# Patient Record
Sex: Male | Born: 1970 | Race: White | Hispanic: No | Marital: Married | Smoking: Never smoker
Health system: Southern US, Community
[De-identification: ages and names within clinical notes are randomized; demographics above are authoritative.]

## PROBLEM LIST (undated history)

## (undated) DIAGNOSIS — R053 Chronic cough: Secondary | ICD-10-CM

## (undated) DIAGNOSIS — T7840XA Allergy, unspecified, initial encounter: Secondary | ICD-10-CM

## (undated) DIAGNOSIS — E785 Hyperlipidemia, unspecified: Secondary | ICD-10-CM

## (undated) DIAGNOSIS — J329 Chronic sinusitis, unspecified: Secondary | ICD-10-CM

## (undated) HISTORY — DX: Chronic cough: R05.3

## (undated) HISTORY — PX: SINUS SURGERY WITH INSTATRAK: SHX5215

## (undated) HISTORY — PX: TONSILLECTOMY: SUR1361

## (undated) HISTORY — PX: ADENOIDECTOMY: SUR15

## (undated) HISTORY — DX: Allergy, unspecified, initial encounter: T78.40XA

## (undated) HISTORY — DX: Hyperlipidemia, unspecified: E78.5

## (undated) HISTORY — DX: Chronic sinusitis, unspecified: J32.9

---

## 2016-04-12 DIAGNOSIS — M545 Low back pain, unspecified: Secondary | ICD-10-CM | POA: Insufficient documentation

## 2017-04-20 NOTE — Progress Notes (Signed)
[facility] [address] [place], [place] [address] Phone: [phone] Subjective:    I'm seeing this patient by the request  of:  Patient, No Pcp Per   CC: Neck pain  CVE:[id number]  Seth Lowery is a 46 y.o. male coming in with complaint of neck pain. Patient states sometimes it seems to be associated with headaches. Patient [place] significant workup over the last 7 a months for more of a chronic sinusitis. Patient has had significant changes as well as some mild shortness of breath. Patient has undergone further testing. Patient states that unfortunately he has not made any significant improvement. Continues that usually in the midmorning to start having significant congestion went and [place] neck pain. Has been seen a massage therapist as well as physical therapy with very minimal benefit. Has been seen by ENT without any significant improvement as well.  Lung testing 10 included a CT angiogram did not show any type of pulmonary embolism but did have bilateral pleural effusions even though they are small.  CT scan of the face didn't show that patient does have left maxillary sinus mucosal thickening in severe opacification of the left anterior ethmoid  No past medical history on file. No past surgical history on file. Social History   Social History  . Marital status: Unknown    Spouse name: N/A  . Number of children: N/A  . Years of education: N/A   Social History Main Topics  . Smoking status: Not on file  . Smokeless tobacco: Not on file  . Alcohol use Not on file  . Drug use: Unknown  . Sexual activity: Not on file   Other Topics Concern  . Not on file   Social History Narrative  . No narrative on file   Allergies not on file No family history on file.   Past medical history, social, surgical and family history all reviewed in electronic medical record.  No pertanent information unless stated regarding to the chief complaint.    Review of Systems:Review of systems updated and as accurate as of 04/20/17  No headache, visual changes, nausea, vomiting, diarrhea, constipation, dizziness, abdominal pain, skin rash, fevers, chills, night sweats, weight loss, swollen lymph nodes, body aches, joint swelling, muscle aches, chest pain, shortness of breath, mood changes.   Objective  There were no vitals taken for this visit. Systems examined below as of 04/20/17   General: No apparent distress alert and oriented x3 mood and affect normal, dressed appropriately.  HEENT: Pupils equal, extraocular movements intact  Respiratory: Patient's speak in full sentences and does not appear short of breath  Cardiovascular: No lower extremity edema, non tender, no erythema  Skin: Warm dry intact with no signs of infection or rash on extremities or on axial skeleton.  Abdomen: Soft nontender  Neuro: Cranial nerves II through XII are intact, neurovascularly intact in all extremities with 2+ DTRs and 2+ pulses.  Lymph: No lymphadenopathy of posterior or anterior cervical chain or axillae bilaterally.  Gait normal with good balance and coordination.  MSK:  Non tender with full range of motion and good stability and symmetric strength and tone of shoulders, elbows, wrist, hip, knee and ankles bilaterally.  Neck: Inspection unremarkable. No palpable stepoffs. Negative Spurling's maneuver. Mild limitation in side bending bilaterally Grip strength and sensation normal in bilateral hands Strength good C4 to T1 distribution No sensory change to C4 to T1 Negative Hoffman sign bilaterally Reflexes normal  Osteopathic findings C2 flexed rotated  and side bent right C4 flexed rotated and side bent left C6 flexed rotated and side bent left T3 extended rotated and side bent right inhaled third rib T5 extended rotated and side bent left     Impression and Recommendations:     This case required medical decision making of moderate  complexity.      Note: This dictation was prepared with Dragon dictation along with smaller phrase technology. Any transcriptional errors that result from this process are unintentional.

## 2017-04-22 ENCOUNTER — Other Ambulatory Visit (INDEPENDENT_AMBULATORY_CARE_PROVIDER_SITE_OTHER): Payer: BLUE CROSS/BLUE SHIELD

## 2017-04-22 ENCOUNTER — Encounter: Payer: Self-pay | Admitting: Family Medicine

## 2017-04-22 ENCOUNTER — Ambulatory Visit (INDEPENDENT_AMBULATORY_CARE_PROVIDER_SITE_OTHER): Payer: BLUE CROSS/BLUE SHIELD | Admitting: Family Medicine

## 2017-04-22 VITALS — BP 126/78 | HR 63 | Ht 76.0 in | Wt 243.0 lb

## 2017-04-22 DIAGNOSIS — R51 Headache: Secondary | ICD-10-CM

## 2017-04-22 DIAGNOSIS — M542 Cervicalgia: Secondary | ICD-10-CM | POA: Diagnosis not present

## 2017-04-22 DIAGNOSIS — R519 Headache, unspecified: Secondary | ICD-10-CM

## 2017-04-22 DIAGNOSIS — J320 Chronic maxillary sinusitis: Secondary | ICD-10-CM | POA: Diagnosis not present

## 2017-04-22 DIAGNOSIS — M999 Biomechanical lesion, unspecified: Secondary | ICD-10-CM | POA: Diagnosis not present

## 2017-04-22 DIAGNOSIS — J329 Chronic sinusitis, unspecified: Secondary | ICD-10-CM | POA: Insufficient documentation

## 2017-04-22 DIAGNOSIS — E663 Overweight: Secondary | ICD-10-CM | POA: Insufficient documentation

## 2017-04-22 LAB — C-REACTIVE PROTEIN: CRP: 0.4 mg/dL — ABNORMAL LOW (ref 0.5–20.0)

## 2017-04-22 LAB — IBC PANEL
IRON: 126 ug/dL (ref 42–165)
SATURATION RATIOS: 28.6 % (ref 20.0–50.0)
TRANSFERRIN: 315 mg/dL (ref 212.0–360.0)

## 2017-04-22 LAB — CORTISOL: CORTISOL PLASMA: 9.1 ug/dL

## 2017-04-22 LAB — ANGIOTENSIN CONVERTING ENZYME: Angiotensin-Converting Enzyme: 75 U/L — ABNORMAL HIGH (ref 9–67)

## 2017-04-22 LAB — TSH: TSH: 0.75 u[IU]/mL (ref 0.35–4.50)

## 2017-04-22 LAB — VITAMIN D 25 HYDROXY (VIT D DEFICIENCY, FRACTURES): VITD: 32.87 ng/mL (ref 30.00–100.00)

## 2017-04-22 LAB — SEDIMENTATION RATE: SED RATE: 13 mm/h (ref 0–15)

## 2017-04-22 NOTE — Assessment & Plan Note (Signed)
Patient does have more of the neck pain. And using that this can be multifactorial. We discussed posturing given exercises, work with Product/process development scientist, we discussed ergonomics, we discussed other changes that could be beneficial. Discussed over-the-counter medications. Responded well to osteopathic manipulation. Follow-up again in 3-4 weeks.

## 2017-04-22 NOTE — Assessment & Plan Note (Signed)
Decision today to treat with OMT was based on Physical Exam  After verbal consent patient was treated with HVLA, ME, FPR techniques in cervical, thoracic, lumbar and sacral areas  Patient tolerated the procedure well with improvement in symptoms  Patient given exercises, stretches and lifestyle modifications  See medications in patient instructions if given  Patient will follow up in 4 weeks 
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
## 2017-04-22 NOTE — Patient Instructions (Signed)
Good to see you  Stay active Exercises 3 times a week.  Keep monitor at eye level.  DHEA 50 mg daily for 4 weeks, will help with adrenal glands We will get labs downstairs and I will try to write you in my chart  See me again in 3-4 weeks.

## 2017-04-23 LAB — ANA: ANA: NEGATIVE

## 2017-04-23 LAB — PAN-ANCA
ANCA SCREEN: NEGATIVE
Myeloperoxidase Abs: <1
Serine Protease 3: <1

## 2017-04-23 LAB — RHEUMATOID FACTOR: Rhuematoid fact SerPl-aCnc: <14 IU/mL (ref <14)

## 2017-04-25 ENCOUNTER — Encounter: Payer: Self-pay | Admitting: Family Medicine

## 2017-04-26 NOTE — Telephone Encounter (Signed)
Pt called regarding this, Seth Lowery message got cut off and he wondering what the rest of the message is, he would like his lab results

## 2017-04-29 NOTE — Telephone Encounter (Signed)
Patient is calling again.

## 2017-04-30 ENCOUNTER — Telehealth: Payer: Self-pay | Admitting: Family Medicine

## 2017-04-30 NOTE — Telephone Encounter (Signed)
Patient would like to be worked in sooner if anything opens.  I have moved him up to the 24th.  Patient states he has labs results he would like to review with Dr. Tamala Julian at least early next week if he can.

## 2017-04-30 NOTE — Telephone Encounter (Signed)
Spoke to pt, scheduled appt for 8.15.18.

## 2017-05-01 ENCOUNTER — Encounter: Payer: Self-pay | Admitting: Family Medicine

## 2017-05-01 ENCOUNTER — Ambulatory Visit (INDEPENDENT_AMBULATORY_CARE_PROVIDER_SITE_OTHER): Payer: BLUE CROSS/BLUE SHIELD | Admitting: Family Medicine

## 2017-05-01 VITALS — BP 124/84 | HR 55 | Ht 76.0 in | Wt 240.0 lb

## 2017-05-01 DIAGNOSIS — M542 Cervicalgia: Secondary | ICD-10-CM

## 2017-05-01 DIAGNOSIS — M999 Biomechanical lesion, unspecified: Secondary | ICD-10-CM

## 2017-05-01 MED ORDER — VITAMIN D (ERGOCALCIFEROL) 1.25 MG (50000 UNIT) PO CAPS: 50000.0000 [IU] | ORAL_CAPSULE | ORAL | 0 refills | Status: DC

## 2017-05-01 NOTE — Patient Instructions (Signed)
Great to see yo  Try to keep working on the position at your desk.  Ice is your friend  Posture posture posture Good luck in the next race  Once weekly vitamin D for 12 weeks Continue the DHEA for a total of 4 weeks See me again in 4-6 weeks

## 2017-05-01 NOTE — Progress Notes (Signed)
  [name] [address] [place], [place] [address] Phone: [phone] Subjective:    I'm seeing this patient by the request  of:  Patient, No Pcp Per   CC: Neck pain f/u  [id number]  Seth Lowery is a 46 y.o. male coming in with complaint of neck pain. This seemed to be associated with more of a chronic headaches. There is a possibility with chronic sinusitis. Patient has been treated multiple times. We did start patient on over-the-counter DHEA for potential adrenal insufficiency. Patient states that overall it is significantly improving. Patient since then seems to be making reasonable improvement. Patient states that the position at work has been helpful but still needs to make some other transitions. 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   No past medical history on file. No past surgical history on file. Social History   Social History  . Marital status: Unknown    Spouse name: N/A  . Number of children: N/A  . Years of education: N/A   Social History Main Topics  . Smoking status: Never Smoker  . Smokeless tobacco: Never Used  . Alcohol use None  . Drug use: Unknown  . Sexual activity: Not Asked   Other Topics Concern  . None   Social History Narrative  . None   Not on File No family history on file.   Past medical history, social, surgical and family history all reviewed in electronic medical record.  No pertanent information unless stated regarding to the chief complaint.   Review of Systems: No headache, visual changes, nausea, vomiting, diarrhea, constipation, dizziness, abdominal pain, skin rash, fevers, chills, night sweats, weight loss, swollen lymph nodes, body aches, joint swelling,chest pain, shortness of breath, mood changes.  ositive muscle aches  Objective  Blood pressure 124/84, pulse (!) 55, height 6\' 4"  (1.93 m), weight 240 lb (108.9 kg), SpO2 97 %.   Systems examined below as of 05/01/17 General: NAD A&O x3  mood, affect normal  HEENT: Pupils equal, extraocular movements intact no nystagmus Respiratory: not short of breath at rest or with speaking Cardiovascular: No lower extremity edema, non tender Skin: Warm dry intact with no signs of infection or rash on extremities or on axial skeleton. Abdomen: Soft nontender, no masses Neuro: Cranial nerves  intact, neurovascularly intact in all extremities with 2+ DTRs and 2+ pulses. Lymph: No lymphadenopathy appreciated today  Gait normal with good balance and coordination.  MSK: Non tender with full range of motion and good stability and symmetric strength and tone of shoulders, elbows, wrist,  knee hips and ankles bilaterally.   Neck: Inspection unremarkable. No palpable stepoffs. Negative Spurling's maneuver. ild loss of range of motion lacking the last 5-10 of extension Grip strength and sensation normal in bilateral hands Strength good C4 to T1 distribution No sensory change to C4 to T1 Negative Hoffman sign bilaterally Reflexes normal  Osteopathic findings C4 flexed rotated and side bent left C6 flexed rotated and side bent left T3 extended rotated and side bent right inhaled third rib T9 extended rotated and side bent left L2 flexed rotated and side bent right Sacrum right on right      Impression and Recommendations:     This case required medical decision making of moderate complexity.      Note: This dictation was prepared with Dragon dictation along with smaller phrase technology. Any transcriptional errors that result from this process are unintentional.

## 2017-05-01 NOTE — Assessment & Plan Note (Signed)
Has done relatively well to make pain already. We discussed with patient continuing the medications. Patient's laboratory workup and was found to have some mild low normal vitamin D and will start on supplementation to help with muscle strength and endurance. Patient will continue to be active. Follow-up with me again in 4-6 weeks.

## 2017-05-01 NOTE — Assessment & Plan Note (Signed)
Decision today to treat with OMT was based on Physical Exam  After verbal consent patient was treated with HVLA, ME, FPR techniques in cervical, thoracic, lumbar and sacral areas  Patient tolerated the procedure well with improvement in symptoms  Patient given exercises, stretches and lifestyle modifications  See medications in patient instructions if given  Patient will follow up in 4-6 weeks 

## 2017-05-10 ENCOUNTER — Ambulatory Visit: Payer: BLUE CROSS/BLUE SHIELD | Admitting: Family Medicine

## 2017-05-13 ENCOUNTER — Ambulatory Visit: Payer: BLUE CROSS/BLUE SHIELD | Admitting: Family Medicine

## 2017-05-30 ENCOUNTER — Encounter: Payer: Self-pay | Admitting: Family Medicine

## 2017-05-30 ENCOUNTER — Ambulatory Visit (INDEPENDENT_AMBULATORY_CARE_PROVIDER_SITE_OTHER): Payer: BLUE CROSS/BLUE SHIELD | Admitting: Family Medicine

## 2017-05-30 ENCOUNTER — Ambulatory Visit (INDEPENDENT_AMBULATORY_CARE_PROVIDER_SITE_OTHER)
Admission: RE | Admit: 2017-05-30 | Discharge: 2017-05-30 | Disposition: A | Discharge status: Home / Self Care | Payer: BLUE CROSS/BLUE SHIELD | Source: Ambulatory Visit | Attending: Family Medicine | Admitting: Family Medicine

## 2017-05-30 VITALS — BP 130/90 | HR 70 | Ht 76.0 in | Wt 247.0 lb

## 2017-05-30 DIAGNOSIS — R062 Wheezing: Secondary | ICD-10-CM

## 2017-05-30 DIAGNOSIS — J320 Chronic maxillary sinusitis: Secondary | ICD-10-CM

## 2017-05-30 DIAGNOSIS — M999 Biomechanical lesion, unspecified: Secondary | ICD-10-CM | POA: Diagnosis not present

## 2017-05-30 DIAGNOSIS — J90 Pleural effusion, not elsewhere classified: Secondary | ICD-10-CM | POA: Diagnosis not present

## 2017-05-30 MED ORDER — DOXYCYCLINE HYCLATE 100 MG PO TABS: 100.0000 mg | ORAL_TABLET | Freq: Two times a day (BID) | ORAL | 0 refills | Status: AC

## 2017-05-30 MED ORDER — FLUCONAZOLE 200 MG PO TABS: 200.0000 mg | ORAL_TABLET | Freq: Every day | ORAL | 0 refills | Status: DC

## 2017-05-30 NOTE — Patient Instructions (Signed)
Lets get xray today  Doxycycline 100mg  2 times a day for 2 weeks.  Diflucan daily for 1 week.  Ct with contrast of chest  See me again in 2-3 weeks.

## 2017-05-30 NOTE — Assessment & Plan Note (Signed)
Has had pleural effusion previously. At this time I will repeat chest x-ray and I do feel that a CT scan of his chest as necessary. I would like to further evaluate to make sure that there is no possible: Infection that could be contributing.

## 2017-05-30 NOTE — Assessment & Plan Note (Signed)
Chronic sinusitis again. Patient given medications. Seth Lowery this will be beneficial. We'll also treat for the possibility of any type of fungal [place]. Patient will come back and see me again within 1-2 weeks. Worsening symptomsseek medical attention immediately

## 2017-05-30 NOTE — Progress Notes (Signed)
[facility] [address] [place], [place] [address] Phone: [phone] Subjective:    I'm seeing this patient by the request  of:    CC: Hand and neck pain  ENI:[id number]  Seth Lowery is a 46 y.o. male coming in with complaint of back pain. He says his back is doing better. He has been working on posture and says the exercises have helped. He believes the adjustments are helping.  Patient was having worsening shortness of breath again. Patient feels that he is wheezing again. Patient did have laboratory workup that did not show any signs of Wagner granulomatosis reviewing patient's previous imaging patient did have a CT angiogram done that was negative for any type of pulmonary and [place] and the patient does have pulmonary effusions bilaterally patient was happy and feeling better for some time but overall in the last couple days feeling worse but more of the sinuses.     No past medical history on file. No past surgical history on file. Social History   Social History  . Marital status: Unknown    Spouse name: N/A  . Number of children: N/A  . Years of education: N/A   Social History Main Topics  . Smoking status: Never Smoker  . Smokeless tobacco: Never Used  . Alcohol use None  . Drug use: Unknown  . Sexual activity: Not Asked   Other Topics Concern  . None   Social History Narrative  . None   Not on File No family history on file.   Past medical history, social, surgical and family history all reviewed in electronic medical record.  No pertanent information unless stated regarding to the chief complaint.   Review of Systems:Review of systems updated and as accurate as of 05/30/17  No  visual changes, nausea, vomiting, diarrhea, constipation, dizziness, abdominal pain, skin rash, fevers, chills, night sweats, weight loss, swollen lymph nodes, body aches, joint swelling, chest pain, shortness of breath, mood changes. Positive  headaches  Objective  Blood pressure 130/90, pulse 70, height 6\' 4"  (1.93 m), weight 247 lb (112 kg), SpO2 95 %. Systems examined below as of 05/30/17   General: No apparent distress alert and oriented x3 mood and affect normal, dressed appropriately.  HEENT: Pupils equal, extraocular movements intact Significant increasing tenderness noted. Maxillary sinuses bilaterally Respiratory: Patient does have audible wheezing even with discussing and loosening. Patient does have decrease in breath sounds in the lower lobes bilaterally Cardiovascular: No lower extremity edema, non tender, no erythema  Skin: Warm dry intact with no signs of infection or rash on extremities or on axial skeleton.  Abdomen: Soft nontender  Neuro: Cranial nerves II through XII are intact, neurovascularly intact in all extremities with 2+ DTRs and 2+ pulses.  Lymph: No lymphadenopathy of posterior or anterior cervical chain or axillae bilaterally.  Gait normal with good balance and coordination.  MSK:  Non tender with full range of motion and good stability and symmetric strength and tone of shoulders, elbows, wrist, hip, knee and ankles bilaterally.  Neck: Inspection loss in lordosis. No palpable stepoffs. Negative Spurling's maneuver. Lacks 5 of rotation bilaterally Grip strength and sensation normal in bilateral hands Strength good C4 to T1 distribution No sensory change to C4 to T1 Negative Hoffman sign bilaterally Reflexes normal  Osteopathic findings C2 flexed rotated and side bent left C4 flexed rotated and side bent left C6 flexed rotated and side bent left T3 extended rotated and side bent right inhaled third  rib T9 extended rotated and side bent left L2 flexed rotated and side bent right Sacrum left on left    Impression and Recommendations:     This case required medical decision making of moderate complexity.      Note: This dictation was prepared with Dragon dictation along with smaller  phrase technology. Any transcriptional errors that result from this process are unintentional.

## 2017-05-30 NOTE — Assessment & Plan Note (Signed)
Decision today to treat with OMT was based on Physical Exam  After verbal consent patient was treated with HVLA, ME, FPR techniques in cervical, thoracic, lumbar and sacral areas  Patient tolerated the procedure well with improvement in symptoms  Patient given exercises, stretches and lifestyle modifications  See medications in patient instructions if given  Patient will follow up in 2-3 weeks 

## 2017-06-07 ENCOUNTER — Ambulatory Visit
Admission: RE | Admit: 2017-06-07 | Discharge: 2017-06-07 | Disposition: A | Discharge status: Home / Self Care | Payer: BLUE CROSS/BLUE SHIELD | Source: Ambulatory Visit | Attending: Family Medicine | Admitting: Family Medicine

## 2017-06-07 DIAGNOSIS — J90 Pleural effusion, not elsewhere classified: Secondary | ICD-10-CM

## 2017-06-07 MED ORDER — IOPAMIDOL (ISOVUE-300) INJECTION 61%: 75.0000 mL | Freq: Once | INTRAVENOUS | Status: DC | PRN

## 2017-06-13 ENCOUNTER — Ambulatory Visit (INDEPENDENT_AMBULATORY_CARE_PROVIDER_SITE_OTHER): Payer: BLUE CROSS/BLUE SHIELD | Admitting: Family Medicine

## 2017-06-13 ENCOUNTER — Encounter: Payer: Self-pay | Admitting: Family Medicine

## 2017-06-13 VITALS — BP 128/80 | HR 62 | Ht 76.0 in | Wt 241.0 lb

## 2017-06-13 DIAGNOSIS — M542 Cervicalgia: Secondary | ICD-10-CM

## 2017-06-13 DIAGNOSIS — M999 Biomechanical lesion, unspecified: Secondary | ICD-10-CM

## 2017-06-13 NOTE — Patient Instructions (Signed)
Good to see you  Sorry no good answer Stop the cologne.  Might be time for the surgery  See me again in 4-6 weeks.

## 2017-06-13 NOTE — Progress Notes (Signed)
[name] [address] [place], [place] [address] Phone: [phone] Subjective:   :    CC: Head and neck pain f/u  [id number]  Seth Lowery is a 46 y.o. male coming in with complaint of back pain. He says his back is doing better. He has been working on posture and says the exercises have helped. He believes the adjustments are helping.  Patient was having worsening shortness of breath again. Patient feels that he is wheezing again. Patient did have laboratory workup that did not show any signs of Wagner granulomatosis reviewing patient's previous imaging patient did have a CT angiogram done that was negative for any type of pulmonary and Resolved the patient does have pulmonary effusions bilaterally patient was happy and feeling better for some time but overall in the last couple days feeling worse but more of the sinuses.     No past medical history on file. No past surgical history on file. Social History   Social History  . Marital status: Unknown    Spouse name: N/A  . Number of children: N/A  . Years of education: N/A   Social History Main Topics  . Smoking status: Never Smoker  . Smokeless tobacco: Never Used  . Alcohol use None  . Drug use: Unknown  . Sexual activity: Not Asked   Other Topics Concern  . None   Social History Narrative  . None   Not on File No family history on file.   Past medical history, social, surgical and family history all reviewed in electronic medical record.  No pertanent information unless stated regarding to the chief complaint.   Review of Systems: No visual changes, nausea, vomiting, diarrhea, constipation, dizziness, abdominal pain, skin rash, fevers, chills, night sweats, weight loss, swollen lymph nodes, body aches, joint swelling, muscle aches, chest pain, shortness of breath, mood changes.  Positive headache  Objective  Blood pressure 128/80, pulse 62, height 6\' 4"  (1.93 m), weight 241 lb  (109.3 kg), SpO2 95 %.   Systems examined below as of 06/13/17 General: NAD A&O x3 mood, affect normal  HEENT: Pupils equal, extraocular movements intact no nystagmus Respiratory: not short of breath at rest or with speaking Cardiovascular: No lower extremity edema, non tender Skin: Warm dry intact with no signs of infection or rash on extremities or on axial skeleton. Abdomen: Soft nontender, no masses Neuro: Cranial nerves  intact, neurovascularly intact in all extremities with 2+ DTRs and 2+ pulses. Lymph: No lymphadenopathy appreciated today  Gait normal with good balance and coordination.  MSK: Non tender with full range of motion and good stability and symmetric strength and tone of shoulders, elbows, wrist,  knee hips and ankles bilaterally.   Neck: Inspection loss in lordosis. No palpable stepoffs. Negative Spurling's maneuver. Lacks 5 of rotation bilaterally Grip strength and sensation normal in bilateral hands Strength good C4 to T1 distribution No sensory change to C4 to T1 Negative Hoffman sign bilaterally Reflexes normal  Osteopathic findings C2 flexed rotated and side bent right C4 flexed rotated and side bent left C7 flexed rotated and side bent left T5 extended rotated and side bent left T9 extended rotated and side bent left L2 flexed rotated and side bent right Sacrum right on right     Impression and Recommendations:     This case required medical decision making of moderate complexity.      Note: This dictation was prepared with Dragon dictation along with smaller phrase technology. Any  transcriptional errors that result from this process are unintentional.

## 2017-06-13 NOTE — Assessment & Plan Note (Signed)
Still needs work on posture. Discussed ergonomics when checked is a doing which ones to avoid. We discussed core strength and stability for the lower back pain. Follow-up with me again in 6-8 weeks.

## 2017-06-13 NOTE — Assessment & Plan Note (Signed)
Decision today to treat with OMT was based on Physical Exam  After verbal consent patient was treated with HVLA, ME, FPR techniques in cervical, thoracic, lumbar and sacral areas  Patient tolerated the procedure well with improvement in symptoms  Patient given exercises, stretches and lifestyle modifications  See medications in patient instructions if given  Patient will follow up in 4-8 weeks 
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
## 2017-07-21 ENCOUNTER — Other Ambulatory Visit: Payer: Self-pay | Admitting: Family Medicine

## 2017-07-22 NOTE — Telephone Encounter (Signed)
Refill done.  

## 2018-01-14 LAB — BASIC METABOLIC PANEL
CREATININE: 1 (ref 0.6–1.3)
Glucose: 103
POTASSIUM: 4.4 (ref 3.4–5.3)
SODIUM: 141 (ref 137–147)

## 2018-01-14 LAB — CBC AND DIFFERENTIAL
HCT: 48 (ref 41–53)
HEMOGLOBIN: 16.2 (ref 13.5–17.5)
Neutrophils Absolute: 61
PLATELETS: 249 (ref 150–399)
WBC: 7.6

## 2018-01-14 LAB — HEPATIC FUNCTION PANEL
ALT: 39 (ref 10–40)
AST: 22 (ref 14–40)
Alkaline Phosphatase: 83 (ref 25–125)

## 2018-01-14 LAB — TSH: TSH: 1.38 (ref 0.41–5.90)

## 2018-01-14 LAB — HEMOGLOBIN A1C: Hemoglobin A1C: 16.2

## 2018-01-14 LAB — LIPID PANEL
CHOLESTEROL: 171 (ref 0–200)
HDL: 51 (ref 35–70)
LDL Cholesterol: 118
TRIGLYCERIDES: 74 (ref 40–160)

## 2018-01-16 DIAGNOSIS — J320 Chronic maxillary sinusitis: Secondary | ICD-10-CM | POA: Diagnosis not present

## 2018-01-16 DIAGNOSIS — J340 Abscess, furuncle and carbuncle of nose: Secondary | ICD-10-CM | POA: Diagnosis not present

## 2018-01-16 DIAGNOSIS — J321 Chronic frontal sinusitis: Secondary | ICD-10-CM | POA: Diagnosis not present

## 2018-01-17 DIAGNOSIS — R7301 Impaired fasting glucose: Secondary | ICD-10-CM | POA: Diagnosis not present

## 2018-01-17 DIAGNOSIS — Z0000 Encounter for general adult medical examination without abnormal findings: Secondary | ICD-10-CM | POA: Diagnosis not present

## 2018-01-17 DIAGNOSIS — E663 Overweight: Secondary | ICD-10-CM | POA: Diagnosis not present

## 2018-01-17 DIAGNOSIS — J329 Chronic sinusitis, unspecified: Secondary | ICD-10-CM | POA: Diagnosis not present

## 2018-01-30 DIAGNOSIS — J321 Chronic frontal sinusitis: Secondary | ICD-10-CM | POA: Diagnosis not present

## 2018-07-10 DIAGNOSIS — J3089 Other allergic rhinitis: Secondary | ICD-10-CM | POA: Diagnosis not present

## 2018-08-05 DIAGNOSIS — J320 Chronic maxillary sinusitis: Secondary | ICD-10-CM | POA: Diagnosis not present

## 2018-08-05 DIAGNOSIS — J339 Nasal polyp, unspecified: Secondary | ICD-10-CM | POA: Diagnosis not present

## 2018-08-05 DIAGNOSIS — J340 Abscess, furuncle and carbuncle of nose: Secondary | ICD-10-CM | POA: Diagnosis not present

## 2018-08-05 DIAGNOSIS — J321 Chronic frontal sinusitis: Secondary | ICD-10-CM | POA: Diagnosis not present

## 2018-08-05 DIAGNOSIS — J329 Chronic sinusitis, unspecified: Secondary | ICD-10-CM | POA: Diagnosis not present

## 2018-08-05 HISTORY — PX: NASAL SINUS SURGERY: SHX719

## 2018-09-04 DIAGNOSIS — J340 Abscess, furuncle and carbuncle of nose: Secondary | ICD-10-CM | POA: Diagnosis not present

## 2018-09-18 ENCOUNTER — Ambulatory Visit: Payer: BLUE CROSS/BLUE SHIELD | Admitting: Family Medicine

## 2018-09-29 ENCOUNTER — Ambulatory Visit: Payer: Commercial Managed Care - PPO | Admitting: Family Medicine

## 2018-09-29 ENCOUNTER — Encounter: Payer: Self-pay | Admitting: Family Medicine

## 2018-09-29 VITALS — BP 122/68 | HR 63 | Temp 98.0°F | Ht 76.0 in | Wt 241.0 lb

## 2018-09-29 DIAGNOSIS — L989 Disorder of the skin and subcutaneous tissue, unspecified: Secondary | ICD-10-CM

## 2018-09-29 DIAGNOSIS — J320 Chronic maxillary sinusitis: Secondary | ICD-10-CM

## 2018-09-29 NOTE — Patient Instructions (Signed)
-  Great to meet you today! -You should get a call from dermatology to schedule appt.  -Talk with ENT about using maybe bactroban (mupirocin) inside the nose to help with staph eradication if this continues to be an issue.    -F/u with me in a few months for annual physical

## 2018-09-29 NOTE — Progress Notes (Signed)
Seth Lowery - 48 y.o. male MRN [id number]  Date of birth: [birth year]  Subjective Chief Complaint  Patient presents with  . Sinusitis    He has been having ongoing sinus drainage-had sinus sx done 08/05/18-has improved some-concerned about the drainage    HPI Seth Lowery is a 48 y.o. with history of recurrent sinusitis.  Reports that he had surgery a couple of months ago and symptoms have improved however he continues to get recurrent congestion.   He had a culture completed last month at ENT that showed staph and he was treated with as course of augmentin.  He reports that ENT also told him that there may be a chronic fungal infection that could be contributing to symptoms as well.    He also has concerns about recurrent scalp lesions and areas of decreased pigmentation on the scalp.  Areas are itchy at times.  Would like to see dermatology.   ROS:  A comprehensive ROS was completed and negative except as noted per HPI  No Known Allergies  Past Medical History:  Diagnosis Date  . Recurrent sinusitis     Past Surgical History:  Procedure Laterality Date  . NASAL SINUS SURGERY  08/05/2018  . SINUS SURGERY WITH INSTATRAK      Social History   Socioeconomic History  . Marital status: Unknown    Spouse name: Not on file  . Number of children: Not on file  . Years of education: Not on file  . Highest education level: Not on file  Occupational History  . Not on file  Social Needs  . Financial resource strain: Not on file  . Food insecurity:    Worry: Not on file    Inability: Not on file  . Transportation needs:    Medical: Not on file    Non-medical: Not on file  Tobacco Use  . Smoking status: Never Smoker  . Smokeless tobacco: Never Used  Substance and Sexual Activity  . Alcohol use: Not on file  . Drug use: Not on file  . Sexual activity: Not on file  Lifestyle  . Physical activity:    Days per week: Not on file    Minutes per session: Not on file  . Stress:  Not on file  Relationships  . Social connections:    Talks on phone: Not on file    Gets together: Not on file    Attends religious service: Not on file    Active member of club or organization: Not on file    Attends meetings of clubs or organizations: Not on file    Relationship status: Not on file  Other Topics Concern  . Not on file  Social History Narrative  . Not on file    No family history on file.  Health Maintenance  Topic Date Due  . HIV Screening  07/14/1986  . TETANUS/TDAP  07/14/1990  . INFLUENZA VACCINE  Completed    ----------------------------------------------------------------------------------------------------------------------------------------------------------------------------------------------------------------- Physical Exam BP 122/68   Pulse 63   Temp 98 F (36.7 C) (Oral)   Ht 6\' 4"  (1.93 m)   Wt 241 lb (109.3 kg)   SpO2 98%   BMI 29.34 kg/m   Physical Exam Constitutional:      Appearance: Normal appearance. He is not ill-appearing.  HENT:     Head: Normocephalic and atraumatic.     Right Ear: Tympanic membrane normal.     Left Ear: Tympanic membrane normal.     Nose: No congestion.  Mouth/Throat:     Mouth: Mucous membranes are moist.  Eyes:     General: No scleral icterus. Neck:     Musculoskeletal: Neck supple.  Cardiovascular:     Rate and Rhythm: Normal rate and regular rhythm.  Pulmonary:     Effort: Pulmonary effort is normal.     Breath sounds: Normal breath sounds.  Skin:    Comments: Hypopigmented macules with a couple scattered erythematous patches on scalp   Neurological:     General: No focal deficit present.     Mental Status: He is alert.  Psychiatric:        Mood and Affect: Mood normal.        Behavior: Behavior normal.      ------------------------------------------------------------------------------------------------------------------------------------------------------------------------------------------------------------------- Assessment and Plan  Chronic sinusitis -Recommend continuing to follow with ENT.   -Previous culture with staph, may benefit from intranasal mupirocin.  He will discuss with ENT  Scalp lesion -Scattered erythematous patches with areas of post-inflammatory hypopigmentation.  Referral placed to dermatology.

## 2018-10-01 DIAGNOSIS — L989 Disorder of the skin and subcutaneous tissue, unspecified: Secondary | ICD-10-CM | POA: Insufficient documentation

## 2018-10-01 NOTE — Assessment & Plan Note (Signed)
-  Scattered erythematous patches with areas of post-inflammatory hypopigmentation.  Referral placed to dermatology.

## 2018-10-01 NOTE — Assessment & Plan Note (Signed)
-  Recommend continuing to follow with ENT.   -Previous culture with staph, may benefit from intranasal mupirocin.  He will discuss with ENT

## 2018-10-02 ENCOUNTER — Encounter: Payer: Self-pay | Admitting: Family Medicine

## 2018-10-07 ENCOUNTER — Encounter: Payer: Self-pay | Admitting: Family Medicine

## 2018-11-10 DIAGNOSIS — L739 Follicular disorder, unspecified: Secondary | ICD-10-CM | POA: Diagnosis not present

## 2018-11-10 DIAGNOSIS — D229 Melanocytic nevi, unspecified: Secondary | ICD-10-CM | POA: Diagnosis not present

## 2018-11-11 DIAGNOSIS — Z131 Encounter for screening for diabetes mellitus: Secondary | ICD-10-CM | POA: Diagnosis not present

## 2018-11-11 DIAGNOSIS — R5383 Other fatigue: Secondary | ICD-10-CM | POA: Diagnosis not present

## 2018-11-11 DIAGNOSIS — N5089 Other specified disorders of the male genital organs: Secondary | ICD-10-CM | POA: Diagnosis not present

## 2018-11-24 ENCOUNTER — Encounter: Payer: Commercial Managed Care - PPO | Admitting: Family Medicine

## 2018-12-24 ENCOUNTER — Telehealth: Payer: Self-pay | Admitting: Family Medicine

## 2018-12-24 NOTE — Telephone Encounter (Signed)
Called patient and left vm to r/s appt for 01/19/2019 a few months out.

## 2019-01-19 ENCOUNTER — Encounter: Payer: Commercial Managed Care - PPO | Admitting: Family Medicine

## 2019-03-31 ENCOUNTER — Telehealth: Payer: Self-pay

## 2019-03-31 NOTE — Telephone Encounter (Signed)
Questions for Screening COVID-19  Symptom onset: None  Travel or Contacts: None  During this illness, did/does the patient experience any of the following symptoms? Fever >100.55F []   Yes [x]   No []   Unknown Subjective fever (felt feverish) []   Yes [x]   No []   Unknown Chills []   Yes [x]   No []   Unknown Muscle aches (myalgia) []   Yes [x]   No []   Unknown Runny nose (rhinorrhea) []   Yes [x]   No []   Unknown Sore throat []   Yes [x]   No []   Unknown Cough (new onset or worsening of chronic cough) []   Yes [x]   No []   Unknown Shortness of breath (dyspnea) []   Yes [x]   No []   Unknown Nausea or vomiting []   Yes [x]   No []   Unknown Headache []   Yes []   No [x]   Unknown Abdominal pain  []   Yes [x]   No []   Unknown Diarrhea (?3 loose/looser than normal stools/24hr period) []   Yes [x]   No []   Unknown Other, specify:  Patient risk factors: Smoker? []   Current []   Former [x]   Never If male, currently pregnant? []   Yes [x]   No  Patient Active Problem List   Diagnosis Date Noted  . Scalp lesion 10/01/2018  . Pleural effusion 05/30/2017  . Overweight 04/22/2017  . Neck pain 04/22/2017  . Nonallopathic lesion of cervical region 04/22/2017  . Nonallopathic lesion of thoracic region 04/22/2017  . Nonallopathic lesion of lumbosacral region 04/22/2017  . Chronic sinusitis 04/22/2017  . Acute low back pain without sciatica 04/12/2016    Plan:  []   High risk for COVID-19 with red flags go to ED (with CP, SOB, weak/lightheaded, or fever > 101.5). Call ahead.  []   High risk for COVID-19 but stable. Inform provider and coordinate time for [facility] visit.   [x]   No red flags but URI signs or symptoms okay for [facility] visit.

## 2019-04-01 ENCOUNTER — Encounter: Payer: Self-pay | Admitting: Family Medicine

## 2019-04-01 ENCOUNTER — Ambulatory Visit (INDEPENDENT_AMBULATORY_CARE_PROVIDER_SITE_OTHER): Payer: Commercial Managed Care - PPO | Admitting: Family Medicine

## 2019-04-01 VITALS — BP 130/82 | HR 78 | Temp 98.0°F | Resp 16 | Ht 75.97 in | Wt 263.0 lb

## 2019-04-01 DIAGNOSIS — Z0000 Encounter for general adult medical examination without abnormal findings: Secondary | ICD-10-CM | POA: Insufficient documentation

## 2019-04-01 DIAGNOSIS — R7989 Other specified abnormal findings of blood chemistry: Secondary | ICD-10-CM | POA: Insufficient documentation

## 2019-04-01 DIAGNOSIS — Z13220 Encounter for screening for lipoid disorders: Secondary | ICD-10-CM | POA: Diagnosis not present

## 2019-04-01 DIAGNOSIS — J320 Chronic maxillary sinusitis: Secondary | ICD-10-CM

## 2019-04-01 DIAGNOSIS — Z131 Encounter for screening for diabetes mellitus: Secondary | ICD-10-CM | POA: Insufficient documentation

## 2019-04-01 LAB — COMPREHENSIVE METABOLIC PANEL
ALT: 25 U/L (ref 0–53)
AST: 22 U/L (ref 0–37)
Albumin: 4.2 g/dL (ref 3.5–5.2)
Alkaline Phosphatase: 71 U/L (ref 39–117)
BUN: 13 mg/dL (ref 6–23)
CO2: 26 mEq/L (ref 19–32)
Calcium: 8.4 mg/dL (ref 8.4–10.5)
Chloride: 105 mEq/L (ref 96–112)
Creatinine, Ser: 1.1 mg/dL (ref 0.40–1.50)
GFR: 71.53 mL/min (ref >60.00)
Glucose, Bld: 132 mg/dL — ABNORMAL HIGH (ref 70–99)
Potassium: 4.1 mEq/L (ref 3.5–5.1)
Sodium: 139 mEq/L (ref 135–145)
Total Bilirubin: 0.5 mg/dL (ref 0.2–1.2)
Total Protein: 6.4 g/dL (ref 6.0–8.3)

## 2019-04-01 LAB — LIPID PANEL
Cholesterol: 123 mg/dL (ref 0–200)
HDL: 41.4 mg/dL (ref >39.00)
LDL Cholesterol: 61 mg/dL (ref 0–99)
NonHDL: 81.25
Total CHOL/HDL Ratio: 3
Triglycerides: 103 mg/dL (ref 0.0–149.0)
VLDL: 20.6 mg/dL (ref 0.0–40.0)

## 2019-04-01 NOTE — Patient Instructions (Signed)
Preventive Care 40-48 Years Old, Male Preventive care refers to lifestyle choices and visits with your health care provider that can promote health and wellness. This includes:  A yearly physical exam. This is also called an annual well check.  Regular dental and eye exams.  Immunizations.  Screening for certain conditions.  Healthy lifestyle choices, such as eating a healthy diet, getting regular exercise, not using drugs or products that contain nicotine and tobacco, and limiting alcohol use. What can I expect for my preventive care visit? Physical exam Your health care provider will check:  Height and weight. These may be used to calculate body mass index (BMI), which is a measurement that tells if you are at a healthy weight.  Heart rate and blood pressure.  Your skin for abnormal spots. Counseling Your health care provider may ask you questions about:  Alcohol, tobacco, and drug use.  Emotional well-being.  Home and relationship well-being.  Sexual activity.  Eating habits.  Work and work environment. What immunizations do I need?  Influenza (flu) vaccine  This is recommended every year. Tetanus, diphtheria, and pertussis (Tdap) vaccine  You may need a Td booster every 10 years. Varicella (chickenpox) vaccine  You may need this vaccine if you have not already been vaccinated. Zoster (shingles) vaccine  You may need this after age 60. Measles, mumps, and rubella (MMR) vaccine  You may need at least one dose of MMR if you were born in 1957 or later. You may also need a second dose. Pneumococcal conjugate (PCV13) vaccine  You may need this if you have certain conditions and were not previously vaccinated. Pneumococcal polysaccharide (PPSV23) vaccine  You may need one or two doses if you smoke cigarettes or if you have certain conditions. Meningococcal conjugate (MenACWY) vaccine  You may need this if you have certain conditions. Hepatitis A vaccine   You may need this if you have certain conditions or if you travel or work in places where you may be exposed to hepatitis A. Hepatitis B vaccine  You may need this if you have certain conditions or if you travel or work in places where you may be exposed to hepatitis B. Haemophilus influenzae type b (Hib) vaccine  You may need this if you have certain risk factors. Human papillomavirus (HPV) vaccine  If recommended by your health care provider, you may need three doses over 6 months. You may receive vaccines as individual doses or as more than one vaccine together in one shot (combination vaccines). Talk with your health care provider about the risks and benefits of combination vaccines. What tests do I need? Blood tests  Lipid and cholesterol levels. These may be checked every 5 years, or more frequently if you are over 50 years old.  Hepatitis C test.  Hepatitis B test. Screening  Lung cancer screening. You may have this screening every year starting at age 55 if you have a 30-pack-year history of smoking and currently smoke or have quit within the past 15 years.  Prostate cancer screening. Recommendations will vary depending on your family history and other risks.  Colorectal cancer screening. All adults should have this screening starting at age 50 and continuing until age 75. Your health care provider may recommend screening at age 45 if you are at increased risk. You will have tests every 1-10 years, depending on your results and the type of screening test.  Diabetes screening. This is done by checking your blood sugar (glucose) after you have not eaten   for a while (fasting). You may have this done every 1-3 years.  Sexually transmitted disease (STD) testing. Follow these instructions at home: Eating and drinking  Eat a diet that includes fresh fruits and vegetables, whole grains, lean protein, and low-fat dairy products.  Take vitamin and mineral supplements as recommended  by your health care provider.  Do not drink alcohol if your health care provider tells you not to drink.  If you drink alcohol: ? Limit how much you have to 0-2 drinks a day. ? Be aware of how much alcohol is in your drink. In the [place] one drink equals one 12 oz bottle of beer (355 mL), one 5 oz glass of wine (148 mL), or one 1 oz glass of hard liquor (44 mL). Lifestyle  Take daily care of your teeth and gums.  Stay active. Exercise for at least 30 minutes on 5 or more days each week.  Do not use any products that contain nicotine or tobacco, such as cigarettes, e-cigarettes, and chewing tobacco. If you need help quitting, ask your health care provider.  If you are sexually active, practice safe sex. Use a condom or other form of protection to prevent STIs (sexually transmitted infections).  Talk with your health care provider about taking a low-dose aspirin every day starting at age 33. What's next?  Go to your health care provider once a year for a well check visit.  Ask your health care provider how often you should have your eyes and teeth checked.  Stay up to date on all vaccines. This information is not intended to replace advice given to you by your health care provider. Make sure you discuss any questions you have with your health care provider. Document Released: 09/30/2015 Document Revised: 08/28/2018 Document Reviewed: 08/28/2018 Elsevier Patient Education  2020 [employer].

## 2019-04-01 NOTE — Assessment & Plan Note (Signed)
-  Testosterone low on solitary reading that was drawn around 11am. ? If actually testosterone deficient without confirmation testing of early am testosterone.  He plans to continue to follow with [facility] for management of this.

## 2019-04-01 NOTE — Progress Notes (Signed)
Seth Lowery - 48 y.o. male MRN [id number]  Date of birth: 1971/07/26  Subjective Chief Complaint  Patient presents with  . Annual Exam    CPE/PE helath form for work/Labs resultes brought in .     HPI Seth Lowery is a 48 y.o. male here today for annual exam.  He has history of recurrent and chronic sinusitis.  Had sinus surgery last year without significant improvement. He is now seeing [facility].  He has been told that he has a chronic fungal infection causing his symptoms.  He is currently being treated with Itraconazole, amphotericin B and a nystatin nasal spray.  He has not noticed a big difference since starting this treatment.  He does seem to be tolerating well.  He reports "flu like" symptoms during infusions he is receiving.  His testosterone was also low at 224, however this was not an early am testosterone level.  He is being prescribed injections for this now as well.   He denies new concerns today.  He needs a form completed for a physical he had through work.    Review of Systems  Constitutional: Negative for chills, fever, malaise/fatigue and weight loss.  HENT: Negative for congestion, ear pain and sore throat.   Eyes: Negative for blurred vision, double vision and pain.  Respiratory: Negative for cough and shortness of breath.   Cardiovascular: Negative for chest pain and palpitations.  Gastrointestinal: Negative for abdominal pain, blood in stool, constipation, heartburn and nausea.  Genitourinary: Negative for dysuria and urgency.  Musculoskeletal: Negative for joint pain and myalgias.  Neurological: Negative for dizziness and headaches.  Endo/Heme/Allergies: Does not bruise/bleed easily.  Psychiatric/Behavioral: Negative for depression. The patient is not nervous/anxious and does not have insomnia.      No Known Allergies  Past Medical History:  Diagnosis Date  . Recurrent sinusitis     Past Surgical History:  Procedure Laterality Date  .  NASAL SINUS SURGERY  08/05/2018  . SINUS SURGERY WITH INSTATRAK      Social History   Socioeconomic History  . Marital status: Unknown    Spouse name: Not on file  . Number of children: Not on file  . Years of education: Not on file  . Highest education level: Not on file  Occupational History  . Not on file  Social Needs  . Financial resource strain: Not hard at all  . Food insecurity    Worry: Never true    Inability: Never true  . Transportation needs    Medical: No    Non-medical: No  Tobacco Use  . Smoking status: Never Smoker  . Smokeless tobacco: Never Used  Substance and Sexual Activity  . Alcohol use: Not Currently    Frequency: Never  . Drug use: Never  . Sexual activity: Yes    Birth control/protection: Condom  Lifestyle  . Physical activity    Days per week: Not on file    Minutes per session: Not on file  . Stress: Not on file  Relationships  . Social connections    Talks on phone: More than three times a week    Gets together: More than three times a week    Attends religious service: Not on file    Active member of club or organization: Not on file    Attends meetings of clubs or organizations: Not on file    Relationship status: Not on file  Other Topics Concern  . Not on file  Social History  Narrative  . Not on file    History reviewed. No pertinent family history.  Health Maintenance  Topic Date Due  . HIV Screening  07/14/1986  . INFLUENZA VACCINE  04/18/2019  . TETANUS/TDAP  12/18/2023    ----------------------------------------------------------------------------------------------------------------------------------------------------------------------------------------------------------------- Physical Exam BP 130/82   Pulse 78   Temp 98 F (36.7 C) (Oral)   Resp 16   Ht 6' 3.97" (1.93 m)   Wt 263 lb (119.3 kg)   SpO2 98%   BMI 32.04 kg/m   Physical Exam Constitutional:      General: He is not in acute distress.     Appearance: He is not ill-appearing or toxic-appearing.  HENT:     Head: Normocephalic and atraumatic.     Right Ear: External ear normal.     Left Ear: External ear normal.     Mouth/Throat:     Mouth: Mucous membranes are moist.  Eyes:     General: No scleral icterus. Neck:     Musculoskeletal: Normal range of motion.     Thyroid: No thyromegaly.  Cardiovascular:     Rate and Rhythm: Normal rate and regular rhythm.     Heart sounds: Normal heart sounds.  Pulmonary:     Effort: Pulmonary effort is normal.     Breath sounds: Normal breath sounds.  Abdominal:     General: Bowel sounds are normal. There is no distension.     Palpations: Abdomen is soft.     Tenderness: There is no abdominal tenderness. There is no guarding.  Lymphadenopathy:     Cervical: No cervical adenopathy.  Skin:    General: Skin is warm and dry.     Findings: No rash.  Neurological:     Mental Status: He is alert and oriented to person, place, and time.     Cranial Nerves: No cranial nerve deficit.     Motor: No abnormal muscle tone.  Psychiatric:        Mood and Affect: Mood normal.        Behavior: Behavior normal.     ------------------------------------------------------------------------------------------------------------------------------------------------------------------------------------------------------------------- Assessment and Plan  Chronic sinusitis -Receiving treatment for fungal infection managed by [facility].  -Labs ordered by [facility] reviewed.   -Warned of potential side effects of these antifungals including increased liver and cardiac toxicity.  Will check lft's today as they were mildly elevated on initial labs from [facility].   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 ? If actually testosterone deficient without confirmation testing of early am testosterone.  He plans to continue to follow with [facility] for management of  this.   Well adult exam Well adult Orders Placed This Encounter  Procedures  . Lipid panel  . Comp Met (CMET)  Immunizations:  UTD Screenings: Lipid panel Anticipatory guidance/Risk factor reduction:  Recommendations per AVS

## 2019-04-01 NOTE — Assessment & Plan Note (Signed)
Well adult Orders Placed This Encounter  Procedures  . Lipid panel  . Comp Met (CMET)  Immunizations:  UTD Screenings: Lipid panel Anticipatory guidance/Risk factor reduction:  Recommendations per AVS

## 2019-04-01 NOTE — Assessment & Plan Note (Signed)
-  Receiving treatment for fungal infection managed by [facility].  -Labs ordered by [facility] reviewed.   -Warned of potential side effects of these antifungals including increased liver and cardiac toxicity.  Will check lft's today as they were mildly elevated on initial labs from [place].

## 2019-04-03 ENCOUNTER — Encounter: Payer: Self-pay | Admitting: Family Medicine

## 2019-04-13 ENCOUNTER — Encounter: Payer: Self-pay | Admitting: Family Medicine

## 2019-04-15 NOTE — Telephone Encounter (Signed)
Form completed.

## 2020-04-05 ENCOUNTER — Encounter: Payer: Self-pay | Admitting: Family Medicine

## 2020-04-05 ENCOUNTER — Other Ambulatory Visit: Payer: Self-pay

## 2020-04-05 ENCOUNTER — Ambulatory Visit (INDEPENDENT_AMBULATORY_CARE_PROVIDER_SITE_OTHER): Payer: Commercial Managed Care - PPO | Admitting: Family Medicine

## 2020-04-05 VITALS — BP 128/78 | HR 70 | Temp 97.9°F | Ht 75.0 in | Wt 270.0 lb

## 2020-04-05 DIAGNOSIS — Z0000 Encounter for general adult medical examination without abnormal findings: Secondary | ICD-10-CM | POA: Diagnosis not present

## 2020-04-05 DIAGNOSIS — J329 Chronic sinusitis, unspecified: Secondary | ICD-10-CM

## 2020-04-05 DIAGNOSIS — I10 Essential (primary) hypertension: Secondary | ICD-10-CM | POA: Diagnosis not present

## 2020-04-05 DIAGNOSIS — R7989 Other specified abnormal findings of blood chemistry: Secondary | ICD-10-CM | POA: Diagnosis not present

## 2020-04-05 DIAGNOSIS — R0683 Snoring: Secondary | ICD-10-CM

## 2020-04-05 DIAGNOSIS — J4520 Mild intermittent asthma, uncomplicated: Secondary | ICD-10-CM

## 2020-04-05 LAB — TESTOSTERONE: Testosterone: 1224.63 ng/dL — ABNORMAL HIGH (ref 300.00–890.00)

## 2020-04-05 LAB — URINALYSIS, ROUTINE W REFLEX MICROSCOPIC
Bilirubin Urine: NEGATIVE
Hgb urine dipstick: NEGATIVE
Ketones, ur: NEGATIVE
Leukocytes,Ua: NEGATIVE
Nitrite: NEGATIVE
RBC / HPF: NONE SEEN (ref 0–?)
Specific Gravity, Urine: 1.02 (ref 1.000–1.030)
Total Protein, Urine: NEGATIVE
Urine Glucose: NEGATIVE
Urobilinogen, UA: 0.2 (ref 0.0–1.0)
WBC, UA: NONE SEEN (ref 0–?)
pH: 6.5 (ref 5.0–8.0)

## 2020-04-05 LAB — COMPREHENSIVE METABOLIC PANEL
ALT: 50 U/L (ref 0–53)
AST: 36 U/L (ref 0–37)
Albumin: 4.2 g/dL (ref 3.5–5.2)
Alkaline Phosphatase: 55 U/L (ref 39–117)
BUN: 16 mg/dL (ref 6–23)
CO2: 29 mEq/L (ref 19–32)
Calcium: 8.8 mg/dL (ref 8.4–10.5)
Chloride: 102 mEq/L (ref 96–112)
Creatinine, Ser: 1.15 mg/dL (ref 0.40–1.50)
GFR: 67.67 mL/min (ref >60.00)
Glucose, Bld: 100 mg/dL — ABNORMAL HIGH (ref 70–99)
Potassium: 4.7 mEq/L (ref 3.5–5.1)
Sodium: 138 mEq/L (ref 135–145)
Total Bilirubin: 0.8 mg/dL (ref 0.2–1.2)
Total Protein: 6.4 g/dL (ref 6.0–8.3)

## 2020-04-05 LAB — LIPID PANEL
Cholesterol: 175 mg/dL (ref 0–200)
HDL: 42.8 mg/dL (ref >39.00)
LDL Cholesterol: 115 mg/dL — ABNORMAL HIGH (ref 0–99)
NonHDL: 132.1
Total CHOL/HDL Ratio: 4
Triglycerides: 88 mg/dL (ref 0.0–149.0)
VLDL: 17.6 mg/dL (ref 0.0–40.0)

## 2020-04-05 LAB — CBC
HCT: 53 % — ABNORMAL HIGH (ref 39.0–52.0)
Hemoglobin: 18.1 g/dL (ref 13.0–17.0)
MCHC: 34.3 g/dL (ref 30.0–36.0)
MCV: 87.2 fl (ref 78.0–100.0)
Platelets: 235 10*3/uL (ref 150.0–400.0)
RBC: 6.08 Mil/uL — ABNORMAL HIGH (ref 4.22–5.81)
RDW: 13.8 % (ref 11.5–15.5)
WBC: 8.5 10*3/uL (ref 4.0–10.5)

## 2020-04-05 LAB — TSH: TSH: 1.63 u[IU]/mL (ref 0.35–4.50)

## 2020-04-05 LAB — LDL CHOLESTEROL, DIRECT: Direct LDL: 118 mg/dL

## 2020-04-05 LAB — HEMOGLOBIN A1C: Hgb A1c MFr Bld: 5.7 % (ref 4.6–6.5)

## 2020-04-05 NOTE — Patient Instructions (Addendum)
Preventive Care 49-49 Years Old, Male Preventive care refers to lifestyle choices and visits with your health care provider that can promote health and wellness. This includes:  A yearly physical exam. This is also called an annual well check.  Regular dental and eye exams.  Immunizations.  Screening for certain conditions.  Healthy lifestyle choices, such as eating a healthy diet, getting regular exercise, not using drugs or products that contain nicotine and tobacco, and limiting alcohol use. What can I expect for my preventive care visit? Physical exam Your health care provider will check:  Height and weight. These may be used to calculate body mass index (BMI), which is a measurement that tells if you are at a healthy weight.  Heart rate and blood pressure.  Your skin for abnormal spots. Counseling Your health care provider may ask you questions about:  Alcohol, tobacco, and drug use.  Emotional well-being.  Home and relationship well-being.  Sexual activity.  Eating habits.  Work and work Statistician. What immunizations do I need?  Influenza (flu) vaccine  This is recommended every year. Tetanus, diphtheria, and pertussis (Tdap) vaccine  You may need a Td booster every 10 years. Varicella (chickenpox) vaccine  You may need this vaccine if you have not already been vaccinated. Zoster (shingles) vaccine  You may need this after age 27. Measles, mumps, and rubella (MMR) vaccine  You may need at least one dose of MMR if you were born in 1957 or later. You may also need a second dose. Pneumococcal conjugate (PCV13) vaccine  You may need this if you have certain conditions and were not previously vaccinated. Pneumococcal polysaccharide (PPSV23) vaccine  You may need one or two doses if you smoke cigarettes or if you have certain conditions. Meningococcal conjugate (MenACWY) vaccine  You may need this if you have certain conditions. Hepatitis A  vaccine  You may need this if you have certain conditions or if you travel or work in places where you may be exposed to hepatitis A. Hepatitis B vaccine  You may need this if you have certain conditions or if you travel or work in places where you may be exposed to hepatitis B. Haemophilus influenzae type b (Hib) vaccine  You may need this if you have certain risk factors. Human papillomavirus (HPV) vaccine  If recommended by your health care provider, you may need three doses over 6 months. You may receive vaccines as individual doses or as more than one vaccine together in one shot (combination vaccines). Talk with your health care provider about the risks and benefits of combination vaccines. What tests do I need? Blood tests  Lipid and cholesterol levels. These may be checked every 5 years, or more frequently if you are over 49 years old.  Hepatitis C test.  Hepatitis B test. Screening  Lung cancer screening. You may have this screening every year starting at age 49 if you have a 30-pack-year history of smoking and currently smoke or have quit within the past 15 years.  Prostate cancer screening. Recommendations will vary depending on your family history and other risks.  Colorectal cancer screening. All adults should have this screening starting at age 49 and continuing until age 26. Your health care provider may recommend screening at age 49 if you are at increased risk. You will have tests every 1-10 years, depending on your results and the type of screening test.  Diabetes screening. This is done by checking your blood sugar (glucose) after you have not eaten  for a while (fasting). You may have this done every 1-3 years.  Sexually transmitted disease (STD) testing. Follow these instructions at home: Eating and drinking  Eat a diet that includes fresh fruits and vegetables, whole grains, lean protein, and low-fat dairy products.  Take vitamin and mineral supplements as  recommended by your health care provider.  Do not drink alcohol if your health care provider tells you not to drink.  If you drink alcohol: ? Limit how much you have to 0-2 drinks a day. ? Be aware of how much alcohol is in your drink. In the [place] one drink equals one 12 oz bottle of beer (355 mL), one 5 oz glass of wine (148 mL), or one 1 oz glass of hard liquor (44 mL). Lifestyle  Take daily care of your teeth and gums.  Stay active. Exercise for at least 30 minutes on 5 or more days each week.  Do not use any products that contain nicotine or tobacco, such as cigarettes, e-cigarettes, and chewing tobacco. If you need help quitting, ask your health care provider.  If you are sexually active, practice safe sex. Use a condom or other form of protection to prevent STIs (sexually transmitted infections).  Talk with your health care provider about taking a low-dose aspirin every day starting at age 49. What's next?  Go to your health care provider once a year for a well check visit.  Ask your health care provider how often you should have your eyes and teeth checked.  Stay up to date on all vaccines. This information is not intended to replace advice given to you by your health care provider. Make sure you discuss any questions you have with your health care provider. Document Revised: 08/28/2018 Document Reviewed: 08/28/2018 Elsevier Patient Education  2020 Elsevier Inc[employer]  Health Maintenance, Male Adopting a healthy lifestyle and getting preventive care are important in promoting health and wellness. Ask your health care provider about:  The right schedule for you to have regular tests and exams.  Things you can do on your own to prevent diseases and keep yourself healthy. What should I know about diet, weight, and exercise? Eat a healthy diet   Eat a diet that includes plenty of vegetables, fruits, low-fat dairy products, and lean protein.  Do not eat a lot of foods that are  high in solid fats, added sugars, or sodium. Maintain a healthy weight Body mass index (BMI) is a measurement that can be used to identify possible weight problems. It estimates body fat based on height and weight. Your health care provider can help determine your BMI and help you achieve or maintain a healthy weight. Get regular exercise Get regular exercise. This is one of the most important things you can do for your health. Most adults should:  Exercise for at least 150 minutes each week. The exercise should increase your heart rate and make you sweat (moderate-intensity exercise).  Do strengthening exercises at least twice a week. This is in addition to the moderate-intensity exercise.  Spend less time sitting. Even light physical activity can be beneficial. Watch cholesterol and blood lipids Have your blood tested for lipids and cholesterol at 49 years of age, then have this test every 5 years. You may need to have your cholesterol levels checked more often if:  Your lipid or cholesterol levels are high.  You are older than 49 years of age.  You are at high risk for heart disease. What should I know about   cancer screening? Many types of cancers can be detected early and may often be prevented. Depending on your health history and family history, you may need to have cancer screening at various ages. This may include screening for:  Colorectal cancer.  Prostate cancer.  Skin cancer.  Lung cancer. What should I know about heart disease, diabetes, and high blood pressure? Blood pressure and heart disease  High blood pressure causes heart disease and increases the risk of stroke. This is more likely to develop in people who have high blood pressure readings, are of African descent, or are overweight.  Talk with your health care provider about your target blood pressure readings.  Have your blood pressure checked: ? Every 3-5 years if you are 64-22 years of age. ? Every year  if you are 37 years old or older.  If you are between the ages of 30 and 7 and are a current or former smoker, ask your health care provider if you should have a one-time screening for abdominal aortic aneurysm (AAA). Diabetes Have regular diabetes screenings. This checks your fasting blood sugar level. Have the screening done:  Once every three years after age 8 if you are at a normal weight and have a low risk for diabetes.  More often and at a younger age if you are overweight or have a high risk for diabetes. What should I know about preventing infection? Hepatitis B If you have a higher risk for hepatitis B, you should be screened for this virus. Talk with your health care provider to find out if you are at risk for hepatitis B infection. Hepatitis C Blood testing is recommended for:  Everyone born from [birth year] through 1965.  Anyone with known risk factors for hepatitis C. Sexually transmitted infections (STIs)  You should be screened each year for STIs, including gonorrhea and chlamydia, if: ? You are sexually active and are younger than 49 years of age. ? You are older than 49 years of age and your health care provider tells you that you are at risk for this type of infection. ? Your sexual activity has changed since you were last screened, and you are at increased risk for chlamydia or gonorrhea. Ask your health care provider if you are at risk.  Ask your health care provider about whether you are at high risk for HIV. Your health care provider may recommend a prescription medicine to help prevent HIV infection. If you choose to take medicine to prevent HIV, you should first get tested for HIV. You should then be tested every 3 months for as long as you are taking the medicine. Follow these instructions at home: Lifestyle  Do not use any products that contain nicotine or tobacco, such as cigarettes, e-cigarettes, and chewing tobacco. If you need help quitting, ask your health care  provider.  Do not use street drugs.  Do not share needles.  Ask your health care provider for help if you need support or information about quitting drugs. Alcohol use  Do not drink alcohol if your health care provider tells you not to drink.  If you drink alcohol: ? Limit how much you have to 0-2 drinks a day. ? Be aware of how much alcohol is in your drink. In the [place] one drink equals one 12 oz bottle of beer (355 mL), one 5 oz glass of wine (148 mL), or one 1 oz glass of hard liquor (44 mL). General instructions  Schedule regular health, dental, and eye exams.  Stay current with your vaccines.  Tell your health care provider if: ? You often feel depressed. ? You have ever been abused or do not feel safe at home. Summary  Adopting a healthy lifestyle and getting preventive care are important in promoting health and wellness.  Follow your health care provider's instructions about healthy diet, exercising, and getting tested or screened for diseases.  Follow your health care provider's instructions on monitoring your cholesterol and blood pressure. This information is not intended to replace advice given to you by your health care provider. Make sure you discuss any questions you have with your health care provider. Document Revised: 08/27/2018 Document Reviewed: 08/27/2018 Elsevier Patient Education  2020 Elsevier Inc.  

## 2020-04-05 NOTE — Progress Notes (Signed)
Established Patient Office Visit  Subjective:  Patient ID: Seth Lowery, male    DOB: 1971-03-13  Age: 49 y.o. MRN: [id number]  CC:  Chief Complaint  Patient presents with  . Transitions Of Care    TOC from Dr. Zigmund Daniel, patient would like to follow up on cronic     HPI Seth Lowery presents for establishment of care by way of transfer and a complete physical.  History of ongoing chronic sinus disease with postnasal drip associated with reactive airway disease.  Status post multiple sinus surgeries with ongoing care from ENT.  He saw an allergist once.  He has gained weight and started snoring.  Feels rested in the morning.  Not aware of apnea.  He does complain of fatigue and sadness associated with a chronic disease.  Physician in [place] is supplying him with a elevated dose of 200 mg testosterone weekly.Last injection was 3 days ago.   No labs have been checked regarding his testosterone in some time.  He used to compete in triathlons but has been unable to do so more recently due to his weight gain.  He does not smoke and rarely drinks alcohol.  He lives with his wife.  Past Medical History:  Diagnosis Date  . Recurrent sinusitis     Past Surgical History:  Procedure Laterality Date  . NASAL SINUS SURGERY  08/05/2018  . SINUS SURGERY WITH INSTATRAK      Family History  Problem Relation Age of Onset  . Healthy Mother   . Diabetes Father     Social History   Socioeconomic History  . Marital status: Unknown    Spouse name: Not on file  . Number of children: Not on file  . Years of education: Not on file  . Highest education level: Not on file  Occupational History  . Not on file  Tobacco Use  . Smoking status: Never Smoker  . Smokeless tobacco: Never Used  Vaping Use  . Vaping Use: Never used  Substance and Sexual Activity  . Alcohol use: Not Currently  . Drug use: Never  . Sexual activity: Yes    Birth control/protection: Condom  Other Topics Concern    . Not on file  Social History Narrative  . Not on file   Social Determinants of Health   Financial Resource Strain:   . Difficulty of Paying Living Expenses:   Food Insecurity:   . Worried About Charity fundraiser in the Last Year:   . Arboriculturist in the Last Year:   Transportation Needs:   . Film/video editor (Medical):   Marland Kitchen Lack of Transportation (Non-Medical):   Physical Activity:   . Days of Exercise per Week:   . Minutes of Exercise per Session:   Stress:   . Feeling of Stress :   Social Connections:   . Frequency of Communication with Friends and Family:   . Frequency of Social Gatherings with Friends and Family:   . Attends Religious Services:   . Active Member of Clubs or Organizations:   . Attends Archivist Meetings:   Marland Kitchen Marital Status:   Intimate Partner Violence:   . Fear of Current or Ex-Partner:   . Emotionally Abused:   Marland Kitchen Physically Abused:   . Sexually Abused:     Outpatient Medications Prior to Visit  Medication Sig Dispense Refill  . testosterone cypionate (DEPOTESTOSTERONE CYPIONATE) 200 MG/ML injection INJECT 1 ML (200MG ) ONCE WEEKLY INTRAMUSCULARLY    .  AMPHOTERICIN B LIPID IV Inject 25 mg into the vein.    . budesonide-formoterol (SYMBICORT) 160-4.5 MCG/ACT inhaler Inhale into the lungs.    . itraconazole (SPORANOX) 100 MG capsule TAKE 2 CAPSULES BY MOUTH DAILY FOR 30 DAYS    . nystatin (MYCOSTATIN/NYSTOP) powder Apply topically 4 (four) times daily.    Marland Kitchen PREVALITE 4 GM/DOSE powder MIX 2 SCOOPS AND TAKE DAILY AS DIRECTED     No facility-administered medications prior to visit.    No Known Allergies  ROS Review of Systems  Constitutional: Negative.   HENT: Positive for congestion, postnasal drip and rhinorrhea.   Eyes: Negative for photophobia and visual disturbance.  Respiratory: Positive for wheezing. Negative for chest tightness and shortness of breath.   Cardiovascular: Negative.   Gastrointestinal: Negative.    Endocrine: Negative for polyphagia and polyuria.  Genitourinary: Negative.   Musculoskeletal: Negative for gait problem and joint swelling.  Skin: Negative for pallor and rash.  Allergic/Immunologic: Negative for immunocompromised state.  Hematological: Does not bruise/bleed easily.  Psychiatric/Behavioral: Negative.       Objective:    Physical Exam Vitals and nursing note reviewed.  Constitutional:      General: He is not in acute distress.    Appearance: Normal appearance. He is not ill-appearing, toxic-appearing or diaphoretic.  HENT:     Head: Normocephalic and atraumatic.     Right Ear: Tympanic membrane, ear canal and external ear normal.     Left Ear: Tympanic membrane and ear canal normal.     Mouth/Throat:     Mouth: Mucous membranes are moist.     Pharynx: Oropharynx is clear. No oropharyngeal exudate or posterior oropharyngeal erythema.   Eyes:     General: No scleral icterus.       Right eye: No discharge.        Left eye: No discharge.     Extraocular Movements: Extraocular movements intact.     Conjunctiva/sclera: Conjunctivae normal.     Pupils: Pupils are equal, round, and reactive to light.  Cardiovascular:     Rate and Rhythm: Normal rate and regular rhythm.  Pulmonary:     Effort: Pulmonary effort is normal. No respiratory distress.     Breath sounds: Normal breath sounds. No stridor. No wheezing.  Abdominal:     General: Abdomen is flat. Bowel sounds are normal. There is no distension.     Palpations: Abdomen is soft. There is no mass.     Tenderness: There is no abdominal tenderness. There is no guarding or rebound.     Hernia: No hernia is present. There is no hernia in the left inguinal area or right inguinal area.  Genitourinary:    Penis: Circumcised. No hypospadias, erythema, tenderness, discharge, swelling or lesions.      Testes:        Right: Mass, tenderness or swelling not present. Right testis is descended.        Left: Mass, tenderness  or swelling not present. Left testis is descended.     Epididymis:     Right: Not inflamed or enlarged. No mass.     Left: Not inflamed or enlarged. No mass.  Musculoskeletal:     Cervical back: No rigidity or tenderness.  Lymphadenopathy:     Cervical: No cervical adenopathy.     Lower Body: No right inguinal adenopathy. No left inguinal adenopathy.  Skin:    General: Skin is warm and dry.  Neurological:     Mental Status: He is alert  and oriented to person, place, and time.  Psychiatric:        Mood and Affect: Mood normal.        Behavior: Behavior normal.     BP 128/78   Pulse 70   Temp 97.9 F (36.6 C) (Tympanic)   Ht 6\' 3"  (1.905 m)   Wt 270 lb (122.5 kg)   SpO2 95%   BMI 33.75 kg/m  Wt Readings from Last 3 Encounters:  04/05/20 270 lb (122.5 kg)  04/01/19 263 lb (119.3 kg)  09/29/18 241 lb (109.3 kg)     Health Maintenance Due  Topic Date Due  . Hepatitis C Screening  Never done  . HIV Screening  Never done    There are no preventive care reminders to display for this patient.  Lab Results  Component Value Date   TSH 1.38 01/14/2018   Lab Results  Component Value Date   WBC 7.6 01/14/2018   HGB 16.2 01/14/2018   HCT 48 01/14/2018   PLT 249 01/14/2018   Lab Results  Component Value Date   NA 139 04/01/2019   K 4.1 04/01/2019   CO2 26 04/01/2019   GLUCOSE 132 (H) 04/01/2019   BUN 13 04/01/2019   CREATININE 1.10 04/01/2019   BILITOT 0.5 04/01/2019   ALKPHOS 71 04/01/2019   AST 22 04/01/2019   ALT 25 04/01/2019   PROT 6.4 04/01/2019   ALBUMIN 4.2 04/01/2019   CALCIUM 8.4 04/01/2019   GFR 71.53 04/01/2019   Lab Results  Component Value Date   CHOL 123 04/01/2019   Lab Results  Component Value Date   HDL 41.40 04/01/2019   Lab Results  Component Value Date   LDLCALC 61 04/01/2019   Lab Results  Component Value Date   TRIG 103.0 04/01/2019   Lab Results  Component Value Date   CHOLHDL 3 04/01/2019   Lab Results  Component  Value Date   HGBA1C 16.2 01/14/2018      Assessment & Plan:   Problem List Items Addressed This Visit      Respiratory   Chronic sinusitis   Relevant Orders   HIV Antibody (routine testing w rflx)   Hepatitis C antibody   Ambulatory referral to Allergy     Other   Low testosterone   Relevant Orders   Testosterone   Well adult exam    Other Visit Diagnoses    Essential hypertension    -  Primary   Relevant Orders   CBC   Comprehensive metabolic panel   LDL cholesterol, direct   Hemoglobin A1c   Lipid panel   TSH   Urinalysis, Routine w reflex microscopic   Mild intermittent reactive airway disease without complication       Relevant Orders   Ambulatory referral to Allergy   Snores       Relevant Orders   Ambulatory referral to Allergy      No orders of the defined types were placed in this encounter.   Follow-up: Return in about 3 months (around 07/06/2020).   Given information on health maintenance and preventive disease.  Start off with allergist referral to review possible allergic cause or trigger for his chronic sinus disease.  Have asked him to evaluate for apnea and reactive airway disease as well.  May need pulmonary consult.  Also consider a referral for second opinion with ENT.  Expressed concern about his high-dose weekly testosterone dose.  Checking his level and CBC today. [name], MD

## 2020-04-06 LAB — HEPATITIS C ANTIBODY
Hepatitis C Ab: NONREACTIVE
SIGNAL TO CUT-OFF: 0 (ref <1.00)

## 2020-04-06 LAB — HIV ANTIBODY (ROUTINE TESTING W REFLEX): HIV 1&2 Ab, 4th Generation: NONREACTIVE

## 2020-04-07 NOTE — Progress Notes (Signed)
Labs did show:  Hemoglobin is elevated. This could lead to a stroke or heart attack. Please stop testosterone replacement therapy now and follow up with your prescribing doctor!  Remainder of labs were okay. Ldl or bad cholesterol is elevated. Please lower fat and cholesterol in your diet.

## 2020-04-15 ENCOUNTER — Telehealth: Payer: Self-pay | Admitting: Family Medicine

## 2020-04-15 NOTE — Telephone Encounter (Signed)
Patient calling states that he had come in a few weeks ago for [facility] from Dr. Zigmund Daniel per patient he would like to have Dr. Ethelene Hal as his PCP and will schedule CPE. At the last visit patient testosterone levels were elevated patient stopped taking testosterone medication as directed calling today to see when would Dr. Ethelene Hal like for him to come in to have levels repeated. Please advise.

## 2020-04-15 NOTE — Telephone Encounter (Signed)
Patient is calling and has questions from his physical and is requesting a call back, please advise. CB is [phone].

## 2020-04-17 NOTE — Telephone Encounter (Signed)
We should probably do a virtual regarding his testosterone. I was under the impression that another doctor was managing this for him.

## 2020-04-19 ENCOUNTER — Telehealth: Payer: Self-pay | Admitting: Family Medicine

## 2020-04-19 NOTE — Telephone Encounter (Signed)
Patient is calling to check on status of physical paperwork he had discussed last week with [place].

## 2020-04-19 NOTE — Telephone Encounter (Signed)
Patient is calling status of previous message and would like a call back, please advise. CB is [phone].

## 2020-04-20 NOTE — Telephone Encounter (Signed)
Patient called back again today and is concerned about messages been returned. Patient stated that paperwork has be turned in this week and requesting a call back, please advise. CB is [phone].

## 2020-04-20 NOTE — Telephone Encounter (Signed)
Spoke with patient scheduled appointment to go over testosterone.

## 2020-04-21 ENCOUNTER — Telehealth (INDEPENDENT_AMBULATORY_CARE_PROVIDER_SITE_OTHER): Payer: Commercial Managed Care - PPO | Admitting: Family Medicine

## 2020-04-21 ENCOUNTER — Encounter: Payer: Self-pay | Admitting: Family Medicine

## 2020-04-21 VITALS — Ht 75.0 in

## 2020-04-21 DIAGNOSIS — D751 Secondary polycythemia: Secondary | ICD-10-CM

## 2020-04-21 DIAGNOSIS — R7989 Other specified abnormal findings of blood chemistry: Secondary | ICD-10-CM

## 2020-04-21 NOTE — Progress Notes (Signed)
Established Patient Office Visit  Subjective:  Patient ID: Seth Lowery, male    DOB: May 21, 1971  Age: 49 y.o. MRN: [id number]  CC:  Chief Complaint  Patient presents with  . Advice Only    discuss elevated testosterone levels and when levels should be repeated after stopping injestions.     HPI Seth Lowery presents for follow-up of his erythrocytosis.  Discontinued testosterone injections 3 weeks ago.  He has been taking the 200 mg dose weekly for years.  Has no prior history of elevated hemoglobin.  Past Medical History:  Diagnosis Date  . Recurrent sinusitis     Past Surgical History:  Procedure Laterality Date  . NASAL SINUS SURGERY  08/05/2018  . SINUS SURGERY WITH INSTATRAK      Family History  Problem Relation Age of Onset  . Healthy Mother   . Diabetes Father     Social History   Socioeconomic History  . Marital status: Unknown    Spouse name: Not on file  . Number of children: Not on file  . Years of education: Not on file  . Highest education level: Not on file  Occupational History  . Not on file  Tobacco Use  . Smoking status: Never Smoker  . Smokeless tobacco: Never Used  Vaping Use  . Vaping Use: Never used  Substance and Sexual Activity  . Alcohol use: Not Currently  . Drug use: Never  . Sexual activity: Yes    Birth control/protection: Condom  Other Topics Concern  . Not on file  Social History Narrative  . Not on file   Social Determinants of Health   Financial Resource Strain:   . Difficulty of Paying Living Expenses:   Food Insecurity:   . Worried About Charity fundraiser in the Last Year:   . Arboriculturist in the Last Year:   Transportation Needs:   . Film/video editor (Medical):   Marland Kitchen Lack of Transportation (Non-Medical):   Physical Activity:   . Days of Exercise per Week:   . Minutes of Exercise per Session:   Stress:   . Feeling of Stress :   Social Connections:   . Frequency of Communication with Friends and  Family:   . Frequency of Social Gatherings with Friends and Family:   . Attends Religious Services:   . Active Member of Clubs or Organizations:   . Attends Archivist Meetings:   Marland Kitchen Marital Status:   Intimate Partner Violence:   . Fear of Current or Ex-Partner:   . Emotionally Abused:   Marland Kitchen Physically Abused:   . Sexually Abused:     Outpatient Medications Prior to Visit  Medication Sig Dispense Refill  . testosterone cypionate (DEPOTESTOSTERONE CYPIONATE) 200 MG/ML injection INJECT 1 ML (200MG ) ONCE WEEKLY INTRAMUSCULARLY (Patient not taking: Reported on 04/21/2020)     No facility-administered medications prior to visit.    No Known Allergies  ROS Review of Systems  Constitutional: Negative.   HENT: Negative.   Respiratory: Negative.   Cardiovascular: Negative.   Gastrointestinal: Negative.   Genitourinary: Negative.   Neurological: Negative for headaches.      Objective:    Physical Exam Vitals and nursing note reviewed.  Constitutional:      General: He is not in acute distress.    Appearance: Normal appearance. He is not ill-appearing, toxic-appearing or diaphoretic.  HENT:     Head: Normocephalic and atraumatic.     Right Ear: External ear normal.  Left Ear: External ear normal.  Eyes:     General: No scleral icterus.       Right eye: No discharge.        Left eye: No discharge.     Conjunctiva/sclera: Conjunctivae normal.  Pulmonary:     Effort: Pulmonary effort is normal.  Skin:    General: Skin is warm and dry.  Neurological:     Mental Status: He is alert and oriented to person, place, and time.  Psychiatric:        Mood and Affect: Mood normal.        Behavior: Behavior normal.     Ht 6\' 3"  (1.905 m)   BMI 33.75 kg/m  Wt Readings from Last 3 Encounters:  04/05/20 270 lb (122.5 kg)  04/01/19 263 lb (119.3 kg)  09/29/18 241 lb (109.3 kg)     Health Maintenance Due  Topic Date Due  . INFLUENZA VACCINE  04/17/2020    There  are no preventive care reminders to display for this patient.  Lab Results  Component Value Date   TSH 1.63 04/05/2020   Lab Results  Component Value Date   WBC 8.5 04/05/2020   HGB 18.1 (HH) 04/05/2020   HCT 53.0 (H) 04/05/2020   MCV 87.2 04/05/2020   PLT 235.0 04/05/2020   Lab Results  Component Value Date   NA 138 04/05/2020   K 4.7 04/05/2020   CO2 29 04/05/2020   GLUCOSE 100 (H) 04/05/2020   BUN 16 04/05/2020   CREATININE 1.15 04/05/2020   BILITOT 0.8 04/05/2020   ALKPHOS 55 04/05/2020   AST 36 04/05/2020   ALT 50 04/05/2020   PROT 6.4 04/05/2020   ALBUMIN 4.2 04/05/2020   CALCIUM 8.8 04/05/2020   GFR 67.67 04/05/2020   Lab Results  Component Value Date   CHOL 175 04/05/2020   Lab Results  Component Value Date   HDL 42.80 04/05/2020   Lab Results  Component Value Date   LDLCALC 115 (H) 04/05/2020   Lab Results  Component Value Date   TRIG 88.0 04/05/2020   Lab Results  Component Value Date   CHOLHDL 4 04/05/2020   Lab Results  Component Value Date   HGBA1C 5.7 04/05/2020      Assessment & Plan:   Problem List Items Addressed This Visit      Other   Low testosterone - Primary   Relevant Orders   Testosterone   Other erythrocytosis   Relevant Orders   CBC   Ferritin      No orders of the defined types were placed in this encounter.   Follow-up: No follow-ups on file.  Continue to hold testosterone for the next 5 weeks and return at that point for today's ordered CBC, ferritin and testosterone level.  Suggested that he consider a [employer] blood donation.  If we are able to restart his testosterone treatment will be bi weekly.  He will follow-up for another virtual visit after his results have returned.  [name], MD   Virtual Visit via Video Note  I connected with Seth Lowery on 04/21/20 at 10:30 AM EDT by a video enabled telemedicine application and verified that I am speaking with the correct person using two  identifiers.  Location: Patient: alone in his office at work.  Provider:    I discussed the limitations of evaluation and management by telemedicine and the availability of in person appointments. The patient expressed understanding and agreed to proceed.  History  of Present Illness:    Observations/Objective:   Assessment and Plan:   Follow Up Instructions:    I discussed the assessment and treatment plan with the patient. The patient was provided an opportunity to ask questions and all were answered. The patient agreed with the plan and demonstrated an understanding of the instructions.   The patient was advised to call back or seek an in-person evaluation if the symptoms worsen or if the condition fails to improve as anticipated.  I provided 20 minutes of non-face-to-face time during this encounter.   [name], MD

## 2020-05-04 ENCOUNTER — Other Ambulatory Visit: Payer: Self-pay

## 2020-05-04 ENCOUNTER — Ambulatory Visit (INDEPENDENT_AMBULATORY_CARE_PROVIDER_SITE_OTHER): Payer: Commercial Managed Care - PPO | Admitting: Allergy and Immunology

## 2020-05-04 ENCOUNTER — Encounter: Payer: Self-pay | Admitting: Allergy and Immunology

## 2020-05-04 VITALS — BP 136/66 | HR 66 | Temp 97.9°F | Resp 16 | Ht 76.0 in | Wt 264.6 lb

## 2020-05-04 DIAGNOSIS — R062 Wheezing: Secondary | ICD-10-CM | POA: Diagnosis not present

## 2020-05-04 DIAGNOSIS — H1013 Acute atopic conjunctivitis, bilateral: Secondary | ICD-10-CM

## 2020-05-04 DIAGNOSIS — J320 Chronic maxillary sinusitis: Secondary | ICD-10-CM

## 2020-05-04 DIAGNOSIS — R05 Cough: Secondary | ICD-10-CM | POA: Diagnosis not present

## 2020-05-04 DIAGNOSIS — R053 Chronic cough: Secondary | ICD-10-CM

## 2020-05-04 DIAGNOSIS — H1010 Acute atopic conjunctivitis, unspecified eye: Secondary | ICD-10-CM | POA: Insufficient documentation

## 2020-05-04 DIAGNOSIS — J3089 Other allergic rhinitis: Secondary | ICD-10-CM | POA: Insufficient documentation

## 2020-05-04 MED ORDER — OMEPRAZOLE 20 MG PO CPDR: DELAYED_RELEASE_CAPSULE | ORAL | 5 refills | Status: DC

## 2020-05-04 MED ORDER — AZELASTINE & FLUTICASONE 137 & 50 MCG/ACT NA THPK: 1.0000 | PACK | Freq: Two times a day (BID) | NASAL | 5 refills | Status: DC | PRN

## 2020-05-04 MED ORDER — ALBUTEROL SULFATE HFA 108 (90 BASE) MCG/ACT IN AERS
2.0000 | INHALATION_SPRAY | RESPIRATORY_TRACT | 1 refills | Status: DC | PRN
Start: 2020-05-04 — End: 2020-12-28

## 2020-05-04 MED ORDER — CARBINOXAMINE MALEATE 6 MG PO TABS: 6.0000 mg | ORAL_TABLET | Freq: Three times a day (TID) | ORAL | 5 refills | Status: DC | PRN

## 2020-05-04 MED ORDER — HYDROCOD POLST-CPM POLST ER 10-8 MG/5ML PO SUER: ORAL | 0 refills | Status: DC

## 2020-05-04 NOTE — Assessment & Plan Note (Signed)
Is unclear if this is related to upper or lower airway noise.  Spirometry reveals a mild restrictive pattern without significant postbronchodilator improvement.  As a therapeutic trial, a prescription has been provided for albuterol HFA, 1 to 2 inhalations every 4-6 hours if needed.  During wheezing/coughing the patient is to assess for symptom improvement with albuterol use.

## 2020-05-04 NOTE — Assessment & Plan Note (Addendum)
The most common causes of chronic cough include the following: upper airway cough syndrome (UACS) from thick postnasal drainage which is caused by variety of rhinosinus conditions; asthma; and/or gastroesophageal reflux disease (GERD) which may present with or without heartburn. The history and physical examination suggest that his cough is multifactorial with contribution primarily from postnasal drainage with possible contribution from bronchial hyperresponsiveness and silent reflux. We will address these issues at this time.   A laboratory order form has been provided for CBC with differential and total IgE level.  These labs are to be drawn before prednisone is started.  Prednisone has been provided, 40 mg x3 days, 20 mg x1 day, 10 mg x1 day, then stop.  A prescription has been provided for Tussionex ER suspension, 5 mL every 12 hours over the next 3 days, then stop.  A prescription has been provided for omeprazole 20 mg daily, 30 minutes prior to breakfast.  Treatment plan as outlined above.    We will regroup in 6 weeks to assess treatment response and adjust therapy accordingly.

## 2020-05-04 NOTE — Assessment & Plan Note (Signed)
   Aeroallergen avoidance measures have been discussed and provided in written form.  A prescription has been provided for RyVent (carbinoxamine maleate) 6mg  every 6-8 hours as needed.  A prescription has been provided for azelastine/fluticasone nasal spray, 1 spray per nostril twice daily as needed. Proper nasal spray technique has been discussed and demonstrated.  Nasal saline lavage (NeilMed) has been recommended as needed and prior to medicated nasal sprays along with instructions for proper administration.  For thick post nasal drainage, add guaifenesin 1200 mg (Mucinex Maximum Strength)  twice daily as needed with adequate hydration as discussed.  The patient is interested in the possibility of initiating immunotherapy if insurance coverage is favorable. He will let us know how he would like to proceed.

## 2020-05-04 NOTE — Progress Notes (Signed)
New Patient Note  RE: Seth Lowery MRN: [id number] DOB: Oct 03, 1970 Date of Office Visit: 05/04/2020  Referring provider: [name],* Primary care provider: [name], MD  Chief Complaint: Sinusitis and Cough   History of present illness: Seth Lowery is a 49 y.o. male seen today in consultation requested by [name], MD.  He reports that over the past 4 years his "quality of life is tanked" because of chronic sinusitis, coughing, and fatigue.  He experiences nasal congestion, "continual" thick postnasal drainage, and coughing.  There is no seasonal variation to the symptoms, however he notes that the symptoms are most frequent and severe from midmorning to mid afternoon.  He has noticed some increase in drainage volume with rapid weather changes.  The cough is occasionally accompanied by wheezing, though he is uncertain if the wheezing is from the chest or upper airways.  He denies overt heartburn.  He has tried numerous medications and has undergone sinus surgery without adequate symptom relief.  He does note that while the symptoms have persisted, including nasal congestion and postnasal drainage, he has not had as many sinus infections since sinus surgery.  He reports that prior to late fall 4 years ago he "would have considered myself and nonallergic person."  He moved into a different home 6 years ago in his environment at work has not changed.  He does not detect mold/mildew in his home or place of work.  Assessment and plan: Seasonal and perennial allergic rhinitis  Aeroallergen avoidance measures have been discussed and provided in written form.  A prescription has been provided for RyVent (carbinoxamine maleate) 6mg  every 6-8 hours as needed.  A prescription has been provided for azelastine/fluticasone nasal spray, 1 spray per nostril twice daily as needed. Proper nasal spray technique has been discussed and demonstrated.  Nasal saline  lavage (NeilMed) has been recommended as needed and prior to medicated nasal sprays along with instructions for proper administration.  For thick post nasal drainage, add guaifenesin 1200 mg (Mucinex Maximum Strength)  twice daily as needed with adequate hydration as discussed.  The patient is interested in the possibility of initiating immunotherapy if insurance coverage is favorable. He will let us know how he would like to proceed.  Cough, persistent The most common causes of chronic cough include the following: upper airway cough syndrome (UACS) from thick postnasal drainage which is caused by variety of rhinosinus conditions; asthma; and/or gastroesophageal reflux disease (GERD) which may present with or without heartburn. The history and physical examination suggest that his cough is multifactorial with contribution primarily from postnasal drainage with possible contribution from bronchial hyperresponsiveness and silent reflux. We will address these issues at this time.   A laboratory order form has been provided for CBC with differential and total IgE level.  These labs are to be drawn before prednisone is started.  Prednisone has been provided, 40 mg x3 days, 20 mg x1 day, 10 mg x1 day, then stop.  A prescription has been provided for Tussionex ER suspension, 5 mL every 12 hours over the next 3 days, then stop.  A prescription has been provided for omeprazole 20 mg daily, 30 minutes prior to breakfast.  Treatment plan as outlined above.    We will regroup in 6 weeks to assess treatment response and adjust therapy accordingly.  Wheezing Is unclear if this is related to upper or lower airway noise.  Spirometry reveals a mild restrictive pattern without significant postbronchodilator improvement.  As a  therapeutic trial, a prescription has been provided for albuterol HFA, 1 to 2 inhalations every 4-6 hours if needed.  During wheezing/coughing the patient is to assess for symptom  improvement with albuterol use.   Meds ordered this encounter  Medications  . chlorpheniramine-HYDROcodone (TUSSIONEX PENNKINETIC ER) 10-8 MG/5ML SUER    Sig: Take 5 ml every 12 hours for 3 days, then stop    Dispense:  30 mL    Refill:  0  . Carbinoxamine Maleate (RYVENT) 6 MG TABS    Sig: Take 6 mg by mouth 3 (three) times daily as needed.    Dispense:  120 tablet    Refill:  5  . omeprazole (PRILOSEC) 20 MG capsule    Sig: 1 capsule 30 minutes before breaskfast    Dispense:  30 capsule    Refill:  5  . albuterol (PROAIR HFA) 108 (90 Base) MCG/ACT inhaler    Sig: Inhale 2 puffs into the lungs every 4 (four) hours as needed for wheezing or shortness of breath.    Dispense:  18 g    Refill:  1  . Azelastine & Fluticasone 137 & 50 MCG/ACT THPK    Sig: Place 1 spray into the nose 2 (two) times daily as needed.    Dispense:  1 each    Refill:  5    Diagnostics: Spirometry: FVC was 4.44 L and FEV1 was 3.37 L (71% predicted) without significant postbronchodilator improvement.  Mild restrictive pattern without significant postbronchodilator reversibility.  Please see scanned spirometry results for details. Epicutaneous testing: Robust reactivity to grass pollen, tree pollen, ragweed, and weed pollen. Intradermal testing: Positive to molds, dog epithelia, dust mite antigen, and cockroach antigen.   Physical examination: Blood pressure 136/66, pulse 66, temperature 97.9 F (36.6 C), temperature source Oral, resp. rate 16, height 6\' 4"  (1.93 m), weight 264 lb 9.6 oz (120 kg), SpO2 96 %.  General: Alert, interactive, in no acute distress. HEENT: TMs pearly gray, turbinates edematous without discharge, post-pharynx erythematous. Neck: Supple without lymphadenopathy. Lungs: Expiratory rhonchi bilaterally. CV: Normal S1, S2 without murmurs. Abdomen: Nondistended, nontender. Skin: Warm and dry, without lesions or rashes. Extremities:  No clubbing, cyanosis or edema. Neuro:   Grossly  intact.  Review of systems:  Review of systems negative except as noted in HPI / PMHx or noted below: Review of Systems  Constitutional: Negative.   HENT: Negative.   Eyes: Negative.   Respiratory: Negative.   Cardiovascular: Negative.   Gastrointestinal: Negative.   Genitourinary: Negative.   Musculoskeletal: Negative.   Skin: Negative.   Neurological: Negative.   Endo/Heme/Allergies: Negative.   Psychiatric/Behavioral: Negative.     Past medical history:  Past Medical History:  Diagnosis Date  . Recurrent sinusitis     Past surgical history:  Past Surgical History:  Procedure Laterality Date  . ADENOIDECTOMY    . NASAL SINUS SURGERY  08/05/2018  . SINUS SURGERY WITH INSTATRAK    . TONSILLECTOMY      Family history: Family History  Problem Relation Age of Onset  . Healthy Mother   . Diabetes Father   . Allergic rhinitis Father   . Angioedema Neg Hx   . Asthma Neg Hx   . Atopy Neg Hx   . Eczema Neg Hx   . Immunodeficiency Neg Hx   . Urticaria Neg Hx     Social history: Social History   Socioeconomic History  . Marital status: Married    Spouse name: Not on file  .  Number of children: Not on file  . Years of education: Not on file  . Highest education level: Not on file  Occupational History  . Not on file  Tobacco Use  . Smoking status: Never Smoker  . Smokeless tobacco: Never Used  Vaping Use  . Vaping Use: Never used  Substance and Sexual Activity  . Alcohol use: Not Currently  . Drug use: Never  . Sexual activity: Yes    Birth control/protection: Condom  Other Topics Concern  . Not on file  Social History Narrative  . Not on file   Social Determinants of Health   Financial Resource Strain:   . Difficulty of Paying Living Expenses:   Food Insecurity:   . Worried About Charity fundraiser in the Last Year:   . Arboriculturist in the Last Year:   Transportation Needs:   . Film/video editor (Medical):   Marland Kitchen Lack of Transportation  (Non-Medical):   Physical Activity:   . Days of Exercise per Week:   . Minutes of Exercise per Session:   Stress:   . Feeling of Stress :   Social Connections:   . Frequency of Communication with Friends and Family:   . Frequency of Social Gatherings with Friends and Family:   . Attends Religious Services:   . Active Member of Clubs or Organizations:   . Attends Archivist Meetings:   Marland Kitchen Marital Status:   Intimate Partner Violence:   . Fear of Current or Ex-Partner:   . Emotionally Abused:   Marland Kitchen Physically Abused:   . Sexually Abused:     Environmental History: The patient lives in a 49 year old house with hardwood floors in the bedroom, gas heat, and central air.  There is mold/water damage in the home.  There is a dog in home which does not have access to his bedroom.  He is a non-smoker.  Current Outpatient Medications  Medication Sig Dispense Refill  . albuterol (PROAIR HFA) 108 (90 Base) MCG/ACT inhaler Inhale 2 puffs into the lungs every 4 (four) hours as needed for wheezing or shortness of breath. 18 g 1  . Azelastine & Fluticasone 137 & 50 MCG/ACT THPK Place 1 spray into the nose 2 (two) times daily as needed. 1 each 5  . Carbinoxamine Maleate (RYVENT) 6 MG TABS Take 6 mg by mouth 3 (three) times daily as needed. 120 tablet 5  . chlorpheniramine-HYDROcodone (TUSSIONEX PENNKINETIC ER) 10-8 MG/5ML SUER Take 5 ml every 12 hours for 3 days, then stop 30 mL 0  . omeprazole (PRILOSEC) 20 MG capsule 1 capsule 30 minutes before breaskfast 30 capsule 5   No current facility-administered medications for this visit.    Known medication allergies: No Known Allergies  I appreciate the opportunity to take part in Burel's care. Please do not hesitate to contact me with questions.  Sincerely,   R. Edgar Frisk, MD

## 2020-05-04 NOTE — Patient Instructions (Addendum)
Seasonal and perennial allergic rhinitis  Aeroallergen avoidance measures have been discussed and provided in written form.  A prescription has been provided for RyVent (carbinoxamine maleate) 6mg  every 6-8 hours as needed.  A prescription has been provided for azelastine/fluticasone nasal spray, 1 spray per nostril twice daily as needed. Proper nasal spray technique has been discussed and demonstrated.  Nasal saline lavage (NeilMed) has been recommended as needed and prior to medicated nasal sprays along with instructions for proper administration.  For thick post nasal drainage, add guaifenesin 1200 mg (Mucinex Maximum Strength)  twice daily as needed with adequate hydration as discussed.  The patient is interested in the possibility of initiating immunotherapy if insurance coverage is favorable. He will let us know how he would like to proceed.  Cough, persistent The most common causes of chronic cough include the following: upper airway cough syndrome (UACS) from thick postnasal drainage which is caused by variety of rhinosinus conditions; asthma; and/or gastroesophageal reflux disease (GERD) which may present with or without heartburn. The history and physical examination suggest that his cough is multifactorial with contribution primarily from postnasal drainage with possible contribution from bronchial hyperresponsiveness and silent reflux. We will address these issues at this time.   A laboratory order form has been provided for CBC with differential and total IgE level.  These labs are to be drawn before prednisone is started.  Prednisone has been provided, 40 mg x3 days, 20 mg x1 day, 10 mg x1 day, then stop.  A prescription has been provided for Tussionex ER suspension, 5 mL every 12 hours over the next 3 days, then stop.  A prescription has been provided for omeprazole 20 mg daily, 30 minutes prior to breakfast.  Treatment plan as outlined above.    We will regroup in 6 weeks  to assess treatment response and adjust therapy accordingly.  Wheezing Is unclear if this is related to upper or lower airway noise.  Spirometry reveals a mild restrictive pattern without significant postbronchodilator improvement.  As a therapeutic trial, a prescription has been provided for albuterol HFA, 1 to 2 inhalations every 4-6 hours if needed.  During wheezing/coughing the patient is to assess for symptom improvement with albuterol use.   Return in about 6 weeks (around 06/15/2020), or if symptoms worsen or fail to improve.   Control of Dust Mite Allergen  House dust mites play a major role in allergic asthma and rhinitis.  They occur in environments with high humidity wherever human skin, the food for dust mites is found. High levels have been detected in dust obtained from mattresses, pillows, carpets, upholstered furniture, bed covers, clothes and soft toys.  The principal allergen of the house dust mite is found in its feces.  A gram of dust may contain 1,000 mites and 250,000 fecal particles.  Mite antigen is easily measured in the air during house cleaning activities.    1. Encase mattresses, including the box spring, and pillow, in an air tight cover.  Seal the zipper end of the encased mattresses with wide adhesive tape. 2. Wash the bedding in water of 130 degrees Farenheit weekly.  Avoid cotton comforters/quilts and flannel bedding: the most ideal bed covering is the dacron comforter. 3. Remove all upholstered furniture from the bedroom. 4. Remove carpets, carpet padding, rugs, and non-washable window drapes from the bedroom.  Wash drapes weekly or use plastic window coverings. 5. Remove all non-washable stuffed toys from the bedroom.  Wash stuffed toys weekly. 6. Have the room cleaned  frequently with a vacuum cleaner and a damp dust-mop.  The patient should not be in a room which is being cleaned and should wait 1 hour after cleaning before going into the room. 7. Close and  seal all heating outlets in the bedroom.  Otherwise, the room will become filled with dust-laden air.  An electric heater can be used to heat the room. Reduce indoor humidity to less than 50%.  Do not use a humidifier.   Reducing Pollen Exposure  The American Academy of Allergy, Asthma and Immunology suggests the following steps to reduce your exposure to pollen during allergy seasons.    1. Do not hang sheets or clothing out to dry; pollen may collect on these items. 2. Do not mow lawns or spend time around freshly cut grass; mowing stirs up pollen. 3. Keep windows closed at night.  Keep car windows closed while driving. 4. Minimize morning activities outdoors, a time when pollen counts are usually at their highest. 5. Stay indoors as much as possible when pollen counts or humidity is high and on windy days when pollen tends to remain in the air longer. 6. Use air conditioning when possible.  Many air conditioners have filters that trap the pollen spores. 7. Use a HEPA room air filter to remove pollen form the indoor air you breathe.   Control of Dog or Cat Allergen  Avoidance is the best way to manage a dog or cat allergy. If you have a dog or cat and are allergic to dog or cats, consider removing the dog or cat from the home. If you have a dog or cat but don't want to find it a new home, or if your family wants a pet even though someone in the household is allergic, here are some strategies that may help keep symptoms at bay:  1. Keep the pet out of your bedroom and restrict it to only a few rooms. Be advised that keeping the dog or cat in only one room will not limit the allergens to that room. 2. Don't pet, hug or kiss the dog or cat; if you do, wash your hands with soap and water. 3. High-efficiency particulate air (HEPA) cleaners run continuously in a bedroom or living room can reduce allergen levels over time. 4. Place electrostatic material sheet in the air inlet vent in the  bedroom. 5. Regular use of a high-efficiency vacuum cleaner or a central vacuum can reduce allergen levels. 6. Giving your dog or cat a bath at least once a week can reduce airborne allergen.   Control of Mold Allergen  Mold and fungi can grow on a variety of surfaces provided certain temperature and moisture conditions exist.  Outdoor molds grow on plants, decaying vegetation and soil.  The major outdoor mold, Alternaria and Cladosporium, are found in very high numbers during hot and dry conditions.  Generally, a late Summer - Fall peak is seen for common outdoor fungal spores.  Rain will temporarily lower outdoor mold spore count, but counts rise rapidly when the rainy period ends.  The most important indoor molds are Aspergillus and Penicillium.  Dark, humid and poorly ventilated basements are ideal sites for mold growth.  The next most common sites of mold growth are the bathroom and the kitchen.  Outdoor [employer] 1. Use air conditioning and keep windows closed 2. Avoid exposure to decaying vegetation. 3. Avoid leaf raking. 4. Avoid grain handling. 5. Consider wearing a face mask if working in moldy areas.  Indoor Mold Control 1. Maintain humidity below 50%. 2. Clean washable surfaces with 5% bleach solution. 3. Remove sources e.g. Contaminated carpets.   Control of Cockroach Allergen  Cockroach allergen has been identified as an important cause of acute attacks of asthma, especially in urban settings.  There are fifty-five species of cockroach that exist in the [place], however only three, the [place], [place] species produce allergen that can affect patients with Asthma.  Allergens can be obtained from fecal particles, egg casings and secretions from cockroaches.    1. Remove food sources. 2. Reduce access to water. 3. Seal access and entry points. 4. Spray runways with 0.5-1% Diazinon or Chlorpyrifos 5. Blow boric acid power under stoves and  refrigerator. 6. Place bait stations (hydramethylnon) at feeding sites.

## 2020-05-06 DIAGNOSIS — J3081 Allergic rhinitis due to animal (cat) (dog) hair and dander: Secondary | ICD-10-CM

## 2020-05-06 NOTE — Progress Notes (Signed)
EXP 05/06/21

## 2020-05-07 LAB — CBC WITH DIFFERENTIAL/PLATELET
Basophils Absolute: 0 10*3/uL (ref 0.0–0.2)
Basos: 1 %
EOS (ABSOLUTE): 0.2 10*3/uL (ref 0.0–0.4)
Eos: 3 %
Hematocrit: 53 % — ABNORMAL HIGH (ref 37.5–51.0)
Hemoglobin: 18 g/dL — ABNORMAL HIGH (ref 13.0–17.7)
Immature Grans (Abs): 0 10*3/uL (ref 0.0–0.1)
Immature Granulocytes: 0 %
Lymphocytes Absolute: 2.4 10*3/uL (ref 0.7–3.1)
Lymphs: 31 %
MCH: 29.3 pg (ref 26.6–33.0)
MCHC: 34 g/dL (ref 31.5–35.7)
MCV: 86 fL (ref 79–97)
Monocytes Absolute: 0.6 10*3/uL (ref 0.1–0.9)
Monocytes: 8 %
Neutrophils Absolute: 4.4 10*3/uL (ref 1.4–7.0)
Neutrophils: 57 %
Platelets: 269 10*3/uL (ref 150–450)
RBC: 6.15 x10E6/uL — ABNORMAL HIGH (ref 4.14–5.80)
RDW: 13.1 % (ref 11.6–15.4)
WBC: 7.7 10*3/uL (ref 3.4–10.8)

## 2020-05-07 LAB — IGE: IgE (Immunoglobulin E), Serum: 333 IU/mL (ref 6–495)

## 2020-05-09 DIAGNOSIS — J301 Allergic rhinitis due to pollen: Secondary | ICD-10-CM

## 2020-05-11 ENCOUNTER — Telehealth: Payer: Self-pay | Admitting: Allergy and Immunology

## 2020-05-11 NOTE — Telephone Encounter (Signed)
Pt.returning call for results of blood work

## 2020-05-11 NOTE — Telephone Encounter (Signed)
Patient called Seth Lowery back. Lab results and message from Dr. Verlin Fester given. Verbalized understanding.

## 2020-05-11 NOTE — Telephone Encounter (Signed)
Called patient back to give lab results. No answer, LM for return call to office.

## 2020-05-13 ENCOUNTER — Telehealth: Payer: Self-pay

## 2020-05-13 NOTE — Telephone Encounter (Signed)
Dr. Verlin Fester,  Last weekend (8/21-8/23) while taking the steroid pack and chlorpheniramine-hydrocodone my symptoms improved a lot!  The drainage and phlem was almost completely non-existent.  The time frame of the day when symptoms are usually worse for me was greatly compressed too.  Instead of a 6-8 hour window of heavier drainage/coughing/phlem (9:00am - 4:00pm) it was just 1-2 hours with light coughing and nearly zero drainage/phlem (11:00am - 12:30pm).    I'm continuing on with the musinex, ryvent, azelastine/fluticasone and omeprazole and using the albuterol as needed through the day but my symptoms have slowly gotten worse this week.  Today, nearly a week later I'm back to the same rattling cough and bringing up large amounts of phlem.  I'll be back in the office to start the allergy shots on Monday, Sept. 13th but wanted to give this feedback while it was fresh on my mind.  Thank you, Murel Shenberger [phone] [email or web address]

## 2020-05-16 NOTE — Telephone Encounter (Signed)
Thanks for the update, Seth Lowery.  Unfortunately, for obvious reasons, we cannot continue prednisone and cough syrup.  Though, we can return to these medications from time to time if warranted.  Continue the treatment plan as listed as well as nasal saline lavage (NeilMed) at least 2 times per day, prior to the nasal sprays.  Unfortunately, the benefit from the allergy injections will take time as you will most likely have to move through the buildup phase toward the maintenance phase before noticing significant relief.

## 2020-05-17 NOTE — Telephone Encounter (Signed)
Routed Dr. Barnett Hatter message via [place] message

## 2020-05-30 ENCOUNTER — Ambulatory Visit (INDEPENDENT_AMBULATORY_CARE_PROVIDER_SITE_OTHER): Payer: Commercial Managed Care - PPO

## 2020-05-30 DIAGNOSIS — J301 Allergic rhinitis due to pollen: Secondary | ICD-10-CM

## 2020-05-30 DIAGNOSIS — J309 Allergic rhinitis, unspecified: Secondary | ICD-10-CM

## 2020-05-30 MED ORDER — EPINEPHRINE 0.3 MG/0.3ML IJ SOAJ: 0.3000 mg | INTRAMUSCULAR | 1 refills | Status: DC | PRN

## 2020-05-30 NOTE — Progress Notes (Signed)
Immunotherapy   Patient Details  Name: Seth Lowery MRN: [id number] Date of Birth: [birth year]  05/30/2020  Seth Lowery pt here to start injections blue vial Following schedule: a  Frequency:weekly Epi-Pen:yes Consent signed and patient instructions given.   Felipa Emory 05/30/2020, 10:48 AM

## 2020-06-01 ENCOUNTER — Other Ambulatory Visit: Payer: Self-pay

## 2020-06-02 ENCOUNTER — Other Ambulatory Visit (INDEPENDENT_AMBULATORY_CARE_PROVIDER_SITE_OTHER): Payer: Commercial Managed Care - PPO

## 2020-06-02 DIAGNOSIS — D751 Secondary polycythemia: Secondary | ICD-10-CM | POA: Diagnosis not present

## 2020-06-02 DIAGNOSIS — R7989 Other specified abnormal findings of blood chemistry: Secondary | ICD-10-CM | POA: Diagnosis not present

## 2020-06-02 LAB — TESTOSTERONE: Testosterone: 237.32 ng/dL — ABNORMAL LOW (ref 300.00–890.00)

## 2020-06-02 LAB — CBC
HCT: 49.5 % (ref 39.0–52.0)
Hemoglobin: 16.9 g/dL (ref 13.0–17.0)
MCHC: 34.1 g/dL (ref 30.0–36.0)
MCV: 85.4 fl (ref 78.0–100.0)
Platelets: 234 10*3/uL (ref 150.0–400.0)
RBC: 5.79 Mil/uL (ref 4.22–5.81)
RDW: 13.1 % (ref 11.5–15.5)
WBC: 7.1 10*3/uL (ref 4.0–10.5)

## 2020-06-02 LAB — FERRITIN: Ferritin: 211.4 ng/mL (ref 22.0–322.0)

## 2020-06-03 ENCOUNTER — Encounter: Payer: Self-pay | Admitting: Family Medicine

## 2020-06-06 ENCOUNTER — Ambulatory Visit (INDEPENDENT_AMBULATORY_CARE_PROVIDER_SITE_OTHER): Payer: Commercial Managed Care - PPO | Admitting: *Deleted

## 2020-06-06 DIAGNOSIS — J309 Allergic rhinitis, unspecified: Secondary | ICD-10-CM | POA: Diagnosis not present

## 2020-06-13 ENCOUNTER — Ambulatory Visit (INDEPENDENT_AMBULATORY_CARE_PROVIDER_SITE_OTHER): Payer: Commercial Managed Care - PPO | Admitting: *Deleted

## 2020-06-13 DIAGNOSIS — J309 Allergic rhinitis, unspecified: Secondary | ICD-10-CM | POA: Diagnosis not present

## 2020-06-20 ENCOUNTER — Ambulatory Visit (INDEPENDENT_AMBULATORY_CARE_PROVIDER_SITE_OTHER): Payer: Commercial Managed Care - PPO | Admitting: *Deleted

## 2020-06-20 DIAGNOSIS — J309 Allergic rhinitis, unspecified: Secondary | ICD-10-CM | POA: Diagnosis not present

## 2020-06-22 NOTE — Patient Instructions (Addendum)
Seasonal and perennial allergic rhinitis Continue allergy injections per protocol Stop Ryvent Start Xyzal 5 mg once a day  Continue azelastine/fluticasone 1 spray each nostril twice a day Continue saline nasal rinses twice a day to help with nasal symptoms. Use this prior to any medicated nasal sprays For thick post nasal drip may use Mucinex Maximum strength 1200 mg twice a day as needed. Make sure to drink plenty of fluids.  Cough persistent Stop Omeprazole 20 mg once a day Refer to ENT Dr. [name]- chronic cough  Wheezing Continue albuterol 2 puffs every 4 hours as needed for cough, wheeze, tightness in chest or shortness of breath.  Please let us know if this treatment plan is not working well for you Schedule follow up appointment in 2 months

## 2020-06-23 ENCOUNTER — Ambulatory Visit: Payer: Commercial Managed Care - PPO | Admitting: Allergy and Immunology

## 2020-06-23 ENCOUNTER — Other Ambulatory Visit: Payer: Self-pay

## 2020-06-23 ENCOUNTER — Encounter: Payer: Self-pay | Admitting: Family

## 2020-06-23 ENCOUNTER — Ambulatory Visit (INDEPENDENT_AMBULATORY_CARE_PROVIDER_SITE_OTHER): Payer: Commercial Managed Care - PPO | Admitting: Family

## 2020-06-23 VITALS — BP 120/78 | HR 61 | Temp 97.2°F | Resp 20

## 2020-06-23 DIAGNOSIS — R062 Wheezing: Secondary | ICD-10-CM

## 2020-06-23 DIAGNOSIS — R053 Chronic cough: Secondary | ICD-10-CM | POA: Diagnosis not present

## 2020-06-23 DIAGNOSIS — J3089 Other allergic rhinitis: Secondary | ICD-10-CM | POA: Diagnosis not present

## 2020-06-23 MED ORDER — LEVOCETIRIZINE DIHYDROCHLORIDE 5 MG PO TABS: 5.0000 mg | ORAL_TABLET | Freq: Every evening | ORAL | 3 refills | Status: DC

## 2020-06-23 NOTE — Progress Notes (Signed)
[address] [place] [address] Dept: [phone]  FOLLOW UP NOTE  Patient ID: Seth Lowery, male    DOB: 1970-11-03  Age: 49 y.o. MRN: [id number] Date of Office Visit: 06/23/2020  Assessment  Chief Complaint: Asthma  HPI Seth Lowery is a 49 year old male who presents for follow-up of seasonal and perennial allergic rhinitis, cough- persistent, and wheezing.  He was last seen on May 04, 2020 by Dr. Verlin Fester.  Seasonal and perennial allergic rhinitis is reported as not well controlled with RyVent 6 mg 3 times a day, azelastine/fluticasone nasal spray 1 spray each nostril twice a day, saline nasal rinse twice a day, and Mucinex as needed.  He continues the buildup process with his allergy injections. He does not feel like RyVent or Mucinex was helping his symptoms. He reports constant drainage down his throat that is yellow to creamy in color.  Before he had sinus surgery 2 years ago the color of the drainage was brown, ,"the color of Nutella," and he was having frequent sinus infections.  He does not feel like he has a sinus infection now and denies any sinus tenderness, or fever and chills. He also has nasal congestion and occasional rhinorrhea. In the past he has tried Claritin, Human resources officer, and Zyrtec with no relief of symptoms.  He continues to still cough and cannot tell that omeprazole has made any difference.  The only thing that seemed to help was the prednisone and Tussionex ER suspension.  He reports that when he was taking these 2 medications along with the other medications it shrunk the window of time when the symptoms were worse.  He reports that the symptoms are usually worse between 9-10 AM to 4-6 PM.  Outside of this timeframe window his symptoms are better.  The symptoms do not change whether he is at home, at work, or out of state.  Once he completed the steroids and Tussionex ER his symptoms went back to baseline.  He reports that this cough started November 2017 and  has changed the quality of his life.  He had a chest x-ray on May 30, 2017 that showed no active cardiopulmonary disease.  He also had a CT chest with contrast on June 07, 2017 showing no significant abnormality seen in the chest.  He has not noticed any changes in his wheezing or cough with the use of the albuterol inhaler.  He wondered if it was safe to get the booster COVID-19 vaccine.  He reports that after he received the [employer] vaccine he had cold like symptoms with a headache.  He denied having any cardiorespiratory, gastrointestinal or cutaneous symptoms.  Instructed him that at this point in time [facility] is not offering booster injections.  Current medications are as listed in the chart.  Drug Allergies:  No Known Allergies  Review of Systems: Review of Systems  Constitutional: Negative for chills and fever.  HENT:       Reports post nasal drip and nasal congestion. Denies rhinorrhea  Eyes:       Denies itchy watery eyes  Respiratory: Positive for wheezing. Negative for shortness of breath.        Reports cough/ wheeze due to post nasal drip  Cardiovascular: Negative for chest pain and palpitations.  Gastrointestinal:       Denies heartburn or reflux symptoms  Genitourinary: Negative for dysuria.  Skin: Negative for itching and rash.  Neurological: Negative for headaches.  Endo/Heme/Allergies: Positive for environmental allergies.  Physical Exam: BP 120/78    Pulse 61    Temp (!) 97.2 F (36.2 C) (Temporal)    Resp 20    SpO2 97%    Physical Exam Constitutional:      Appearance: Normal appearance.  HENT:     Head: Normocephalic and atraumatic.     Comments: Pharynx erythematous. Eyes normal. Ears normal. Nose normal    Right Ear: Tympanic membrane, ear canal and external ear normal.     Left Ear: Tympanic membrane, ear canal and external ear normal.     Nose: Nose normal.     Mouth/Throat:     Mouth: Mucous membranes are moist.       Pharynx: Oropharynx is clear.  Eyes:     Conjunctiva/sclera: Conjunctivae normal.  Cardiovascular:     Rate and Rhythm: Normal rate and regular rhythm.     Heart sounds: Normal heart sounds.  Pulmonary:     Effort: Pulmonary effort is normal.     Breath sounds: Normal breath sounds.     Comments: Lungs clear to auscultation Musculoskeletal:     Cervical back: Neck supple.  Skin:    General: Skin is warm.  Neurological:     Mental Status: He is alert and oriented to person, place, and time.  Psychiatric:        Mood and Affect: Mood normal.        Behavior: Behavior normal.        Thought Content: Thought content normal.        Judgment: Judgment normal.     Diagnostics: FVC 4.45 L, FEV1 3.47 L.  Predicted FVC 6.16 L, FEV1 4.78 L.  Spirometry indicates mild restriction.  Status post bronchodilator response shows FVC 4.41 L, FEV1 3.64 L.  Spirometry indicates mild restriction with no significant bronchodilator response.  Assessment and Plan: 1. Seasonal and perennial allergic rhinitis   2. Cough, persistent   3. Wheezing     No orders of the defined types were placed in this encounter.   Patient Instructions  Seasonal and perennial allergic rhinitis Continue allergy injections per protocol Stop Ryvent Start Xyzal 5 mg once a day  Continue azelastine/fluticasone 1 spray each nostril twice a day Continue saline nasal rinses twice a day to help with nasal symptoms. Use this prior to any medicated nasal sprays For thick post nasal drip may use Mucinex Maximum strength 1200 mg twice a day as needed. Make sure to drink plenty of fluids.  Cough persistent Stop Omeprazole 20 mg once a day Refer to ENT Dr. [name]- chronic cough  Wheezing Continue albuterol 2 puffs every 4 hours as needed for cough, wheeze, tightness in chest or shortness of breath.  Please let us know if this treatment plan is not working well for you Schedule follow up appointment in 2 months   Return  in about 2 months (around 08/23/2020), or if symptoms worsen or fail to improve.    Thank you for the opportunity to care for this patient.  Please do not hesitate to contact me with questions.  Althea Charon, FNP [facility] [place] [facility] [place]

## 2020-06-27 ENCOUNTER — Ambulatory Visit (INDEPENDENT_AMBULATORY_CARE_PROVIDER_SITE_OTHER): Payer: Commercial Managed Care - PPO

## 2020-06-27 DIAGNOSIS — J309 Allergic rhinitis, unspecified: Secondary | ICD-10-CM | POA: Diagnosis not present

## 2020-06-28 ENCOUNTER — Telehealth: Payer: Self-pay

## 2020-06-28 NOTE — Telephone Encounter (Signed)
-----   Message from Althea Charon, [place] sent at 06/23/2020  2:54 PM EDT ----- Please refer to Dr. [name] re: chronic cough

## 2020-06-28 NOTE — Telephone Encounter (Signed)
Thank you :)

## 2020-07-04 ENCOUNTER — Ambulatory Visit (INDEPENDENT_AMBULATORY_CARE_PROVIDER_SITE_OTHER): Payer: Commercial Managed Care - PPO

## 2020-07-04 DIAGNOSIS — J309 Allergic rhinitis, unspecified: Secondary | ICD-10-CM | POA: Diagnosis not present

## 2020-07-06 ENCOUNTER — Other Ambulatory Visit: Payer: Self-pay

## 2020-07-07 ENCOUNTER — Encounter: Payer: Self-pay | Admitting: Family Medicine

## 2020-07-07 ENCOUNTER — Ambulatory Visit (INDEPENDENT_AMBULATORY_CARE_PROVIDER_SITE_OTHER): Payer: Commercial Managed Care - PPO | Admitting: Family Medicine

## 2020-07-07 VITALS — BP 126/70 | HR 69 | Temp 97.9°F | Ht 76.0 in | Wt 270.0 lb

## 2020-07-07 DIAGNOSIS — D751 Secondary polycythemia: Secondary | ICD-10-CM

## 2020-07-07 DIAGNOSIS — R062 Wheezing: Secondary | ICD-10-CM

## 2020-07-07 DIAGNOSIS — R7989 Other specified abnormal findings of blood chemistry: Secondary | ICD-10-CM

## 2020-07-07 LAB — CBC
HCT: 46.2 % (ref 39.0–52.0)
Hemoglobin: 15.8 g/dL (ref 13.0–17.0)
MCHC: 34.3 g/dL (ref 30.0–36.0)
MCV: 84.8 fl (ref 78.0–100.0)
Platelets: 248 10*3/uL (ref 150.0–400.0)
RBC: 5.45 Mil/uL (ref 4.22–5.81)
RDW: 13.7 % (ref 11.5–15.5)
WBC: 7.1 10*3/uL (ref 4.0–10.5)

## 2020-07-07 LAB — TESTOSTERONE: Testosterone: 253.05 ng/dL — ABNORMAL LOW (ref 300.00–890.00)

## 2020-07-07 MED ORDER — PREDNISONE 10 MG PO TABS: 10.0000 mg | ORAL_TABLET | Freq: Two times a day (BID) | ORAL | 0 refills | Status: AC

## 2020-07-07 MED ORDER — TESTOSTERONE CYPIONATE 200 MG/ML IM SOLN: 200.0000 mg | INTRAMUSCULAR | 0 refills | Status: DC

## 2020-07-07 NOTE — Progress Notes (Addendum)
Established Patient Office Visit  Subjective:  Patient ID: Seth Lowery, male    DOB: [birth year]  Age: 49 y.o. MRN: [id number]  CC:  Chief Complaint  Patient presents with  . Follow-up    3 month follow up, no concerns.    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  He discontinued the testosterone and his hemoglobin fell back into the normal limits.  He is back today to consider restarting testosterone therapy.  Currently under the care of his allergist for chronic rhinitis cough with wheezing.  He has been tested for asthma and that testing has been negative.  He has been referred to ENT for further evaluation.  He denies any appreciable reflux symptoms.  He does admit decreased energy after stopping testosterone.  He has been able to get his own injections in the past.  Past Medical History:  Diagnosis Date  . Recurrent sinusitis     Past Surgical History:  Procedure Laterality Date  . ADENOIDECTOMY    . NASAL SINUS SURGERY  08/05/2018  . SINUS SURGERY WITH INSTATRAK    . TONSILLECTOMY      Family History  Problem Relation Age of Onset  . Healthy Mother   . Diabetes Father   . Allergic rhinitis Father   . Angioedema Neg Hx   . Asthma Neg Hx   . Atopy Neg Hx   . Eczema Neg Hx   . Immunodeficiency Neg Hx   . Urticaria Neg Hx     Social History   Socioeconomic History  . Marital status: Married    Spouse name: Not on file  . Number of children: Not on file  . Years of education: Not on file  . Highest education level: Not on file  Occupational History  . Not on file  Tobacco Use  . Smoking status: Never Smoker  . Smokeless tobacco: Never Used  Vaping Use  . Vaping Use: Never used  Substance and Sexual Activity  . Alcohol use: Not Currently  . Drug use: Never  . Sexual activity: Yes    Birth control/protection: Condom  Other Topics Concern  . Not on file  Social History Narrative  . Not on file   Social  Determinants of Health   Financial Resource Strain:   . Difficulty of Paying Living Expenses: Not on file  Food Insecurity:   . Worried About Charity fundraiser in the Last Year: Not on file  . Ran Out of Food in the Last Year: Not on file  Transportation Needs:   . Lack of Transportation (Medical): Not on file  . Lack of Transportation (Non-Medical): Not on file  Physical Activity:   . Days of Exercise per Week: Not on file  . Minutes of Exercise per Session: Not on file  Stress:   . Feeling of Stress : Not on file  Social Connections:   . Frequency of Communication with Friends and Family: Not on file  . Frequency of Social Gatherings with Friends and Family: Not on file  . Attends Religious Services: Not on file  . Active Member of Clubs or Organizations: Not on file  . Attends Archivist Meetings: Not on file  . Marital Status: Not on file  Intimate Partner Violence:   . Fear of Current or Ex-Partner: Not on file  . Emotionally Abused: Not on file  . Physically Abused: Not on file  . Sexually Abused: Not on file  Outpatient Medications Prior to Visit  Medication Sig Dispense Refill  . albuterol (PROAIR HFA) 108 (90 Base) MCG/ACT inhaler Inhale 2 puffs into the lungs every 4 (four) hours as needed for wheezing or shortness of breath. 18 g 1  . Azelastine & Fluticasone 137 & 50 MCG/ACT THPK Place 1 spray into the nose 2 (two) times daily as needed. 1 each 5  . EPINEPHrine (AUVI-Q) 0.3 mg/0.3 mL IJ SOAJ injection Inject 0.3 mg into the muscle as needed for anaphylaxis. 1 each 1  . levocetirizine (XYZAL) 5 MG tablet Take 1 tablet (5 mg total) by mouth every evening. 30 tablet 3  . omeprazole (PRILOSEC) 20 MG capsule 1 capsule 30 minutes before breaskfast 30 capsule 5  . Carbinoxamine Maleate (RYVENT) 6 MG TABS Take 6 mg by mouth 3 (three) times daily as needed. 120 tablet 5  . chlorpheniramine-HYDROcodone (TUSSIONEX PENNKINETIC ER) 10-8 MG/5ML SUER Take 5 ml every 12  hours for 3 days, then stop 30 mL 0   No facility-administered medications prior to visit.    No Known Allergies  ROS Review of Systems  Constitutional: Positive for fatigue. Negative for chills, diaphoresis, fever and unexpected weight change.  HENT: Positive for postnasal drip, rhinorrhea and sneezing.   Respiratory: Positive for cough.   Cardiovascular: Negative.   Gastrointestinal: Negative.   Neurological: Negative for syncope and speech difficulty.  Psychiatric/Behavioral: Negative.       Objective:    Physical Exam Vitals and nursing note reviewed.  Constitutional:      General: He is not in acute distress.    Appearance: Normal appearance. He is obese. He is not ill-appearing, toxic-appearing or diaphoretic.  HENT:     Head: Normocephalic and atraumatic.     Right Ear: External ear normal.     Left Ear: External ear normal.  Eyes:     General: No scleral icterus.       Right eye: No discharge.        Left eye: No discharge.     Conjunctiva/sclera: Conjunctivae normal.  Cardiovascular:     Rate and Rhythm: Normal rate and regular rhythm.  Pulmonary:     Effort: No respiratory distress.     Breath sounds: No stridor. Wheezing present. No rhonchi or rales.  Abdominal:     General: Bowel sounds are normal.  Musculoskeletal:     Cervical back: No rigidity or tenderness.  Lymphadenopathy:     Cervical: No cervical adenopathy.  Skin:    General: Skin is warm and dry.  Neurological:     Mental Status: He is alert and oriented to person, place, and time.  Psychiatric:        Mood and Affect: Mood normal.        Behavior: Behavior normal.     BP 126/70   Pulse 69   Temp 97.9 F (36.6 C) (Tympanic)   Ht 6\' 4"  (1.93 m)   Wt 270 lb (122.5 kg)   SpO2 94%   BMI 32.87 kg/m  Wt Readings from Last 3 Encounters:  07/07/20 270 lb (122.5 kg)  05/04/20 264 lb 9.6 oz (120 kg)  04/05/20 270 lb (122.5 kg)     There are no preventive care reminders to display for  this patient.  There are no preventive care reminders to display for this patient.  Lab Results  Component Value Date   TSH 1.63 04/05/2020   Lab Results  Component Value Date   WBC 7.1 07/07/2020   HGB  15.8 07/07/2020   HCT 46.2 07/07/2020   MCV 84.8 07/07/2020   PLT 248.0 07/07/2020   Lab Results  Component Value Date   NA 138 04/05/2020   K 4.7 04/05/2020   CO2 29 04/05/2020   GLUCOSE 100 (H) 04/05/2020   BUN 16 04/05/2020   CREATININE 1.15 04/05/2020   BILITOT 0.8 04/05/2020   ALKPHOS 55 04/05/2020   AST 36 04/05/2020   ALT 50 04/05/2020   PROT 6.4 04/05/2020   ALBUMIN 4.2 04/05/2020   CALCIUM 8.8 04/05/2020   GFR 67.67 04/05/2020   Lab Results  Component Value Date   CHOL 175 04/05/2020   Lab Results  Component Value Date   HDL 42.80 04/05/2020   Lab Results  Component Value Date   LDLCALC 115 (H) 04/05/2020   Lab Results  Component Value Date   TRIG 88.0 04/05/2020   Lab Results  Component Value Date   CHOLHDL 4 04/05/2020   Lab Results  Component Value Date   HGBA1C 5.7 04/05/2020      Assessment & Plan:   Problem List Items Addressed This Visit      Other   Low testosterone - Primary   Relevant Medications   testosterone cypionate (DEPOTESTOSTERONE CYPIONATE) 200 MG/ML injection   Other Relevant Orders   Testosterone (Completed)   Other erythrocytosis   Relevant Orders   CBC (Completed)   Wheezing   Relevant Medications   predniSONE (DELTASONE) 10 MG tablet      Meds ordered this encounter  Medications  . predniSONE (DELTASONE) 10 MG tablet    Sig: Take 1 tablet (10 mg total) by mouth 2 (two) times daily with a meal for 5 days.    Dispense:  10 tablet    Refill:  0  . testosterone cypionate (DEPOTESTOSTERONE CYPIONATE) 200 MG/ML injection    Sig: Inject 1 mL (200 mg total) into the muscle every 14 (fourteen) days.    Dispense:  7 mL    Refill:  0    Follow-up: Return in about 3 months (around 10/07/2020).   Will likely  restart prednisone 200 mg biweekly.  Follow-up in 3 months.  Recheck CBC.  Suggested that he become a [employer] donor.  Discussed hematology's feelings about this practice.  Discussed possible referral for endocrinology evaluation. [name], MD

## 2020-07-07 NOTE — Addendum Note (Signed)
Addended by: Jon Billings on: 07/07/2020 01:11 PM   Modules accepted: Orders

## 2020-07-11 ENCOUNTER — Ambulatory Visit (INDEPENDENT_AMBULATORY_CARE_PROVIDER_SITE_OTHER): Payer: Commercial Managed Care - PPO | Admitting: *Deleted

## 2020-07-11 DIAGNOSIS — J309 Allergic rhinitis, unspecified: Secondary | ICD-10-CM | POA: Diagnosis not present

## 2020-07-19 ENCOUNTER — Ambulatory Visit (INDEPENDENT_AMBULATORY_CARE_PROVIDER_SITE_OTHER): Payer: Commercial Managed Care - PPO

## 2020-07-19 DIAGNOSIS — J309 Allergic rhinitis, unspecified: Secondary | ICD-10-CM | POA: Diagnosis not present

## 2020-07-26 ENCOUNTER — Ambulatory Visit (INDEPENDENT_AMBULATORY_CARE_PROVIDER_SITE_OTHER): Payer: Commercial Managed Care - PPO

## 2020-07-26 DIAGNOSIS — J309 Allergic rhinitis, unspecified: Secondary | ICD-10-CM | POA: Diagnosis not present

## 2020-08-01 ENCOUNTER — Ambulatory Visit (INDEPENDENT_AMBULATORY_CARE_PROVIDER_SITE_OTHER): Payer: Commercial Managed Care - PPO

## 2020-08-01 DIAGNOSIS — J309 Allergic rhinitis, unspecified: Secondary | ICD-10-CM

## 2020-08-08 ENCOUNTER — Ambulatory Visit (INDEPENDENT_AMBULATORY_CARE_PROVIDER_SITE_OTHER): Payer: Commercial Managed Care - PPO

## 2020-08-08 DIAGNOSIS — J309 Allergic rhinitis, unspecified: Secondary | ICD-10-CM

## 2020-08-15 ENCOUNTER — Ambulatory Visit (INDEPENDENT_AMBULATORY_CARE_PROVIDER_SITE_OTHER): Payer: Commercial Managed Care - PPO

## 2020-08-15 DIAGNOSIS — J309 Allergic rhinitis, unspecified: Secondary | ICD-10-CM | POA: Diagnosis not present

## 2020-08-22 NOTE — Patient Instructions (Addendum)
Seasonal and perennial allergic rhinitis Continue allergy injections per protocol Continue Xyzal 5 mg once a day  Stop azelastine/fluticasone nasal spray when you start steroid/antibiotic saline rinse from Dr. [name] Start azelastine nasal spray using 1-2 sprays each nostril twice a day as needed for drainage  Cough persistent Continue to follow up with ENT Dr. [name] and follow his plan- chronic cough  Wheezing Continue albuterol 2 puffs every 4 hours as needed for cough, wheeze, tightness in chest or shortness of breath.  Please let us know if this treatment plan is not working well for you Schedule a follow up appointment in 4 months

## 2020-08-23 ENCOUNTER — Encounter: Payer: Self-pay | Admitting: Family

## 2020-08-23 ENCOUNTER — Ambulatory Visit: Payer: Self-pay

## 2020-08-23 ENCOUNTER — Ambulatory Visit (INDEPENDENT_AMBULATORY_CARE_PROVIDER_SITE_OTHER): Payer: Commercial Managed Care - PPO | Admitting: Family

## 2020-08-23 ENCOUNTER — Other Ambulatory Visit: Payer: Self-pay

## 2020-08-23 VITALS — BP 130/82 | HR 86 | Temp 98.0°F | Resp 18

## 2020-08-23 DIAGNOSIS — R053 Chronic cough: Secondary | ICD-10-CM | POA: Diagnosis not present

## 2020-08-23 DIAGNOSIS — J309 Allergic rhinitis, unspecified: Secondary | ICD-10-CM

## 2020-08-23 DIAGNOSIS — J3089 Other allergic rhinitis: Secondary | ICD-10-CM

## 2020-08-23 DIAGNOSIS — R062 Wheezing: Secondary | ICD-10-CM

## 2020-08-23 MED ORDER — AZELASTINE HCL 0.1 % NA SOLN: NASAL | 5 refills | Status: DC

## 2020-08-23 MED ORDER — LEVOCETIRIZINE DIHYDROCHLORIDE 5 MG PO TABS: 5.0000 mg | ORAL_TABLET | Freq: Every day | ORAL | 5 refills | Status: DC | PRN

## 2020-08-23 NOTE — Progress Notes (Addendum)
[address] [place] [address] Dept: [phone]  FOLLOW UP NOTE  Patient ID: Seth Lowery, male    DOB: [birth year]  Age: 49 y.o. MRN: [id number] Date of Office Visit: 08/23/2020  Assessment  Chief Complaint: Allergies (followed up with dr [name] doing allergy shots doing about the same)  HPI Seth Lowery is a 49 year old male who presents today for follow-up of seasonal and perennial allergic rhinitis, persistent cough, and wheezing. He was last seen by myself on June 23, 2020.  Seasonal and perennial allergic rhinitis is reported as not well controlled with Xyzal 5 mg at night, Claritin 10 mg in the morning,allergy injection days, azelastine/fluticasone nasal spray, and saline rinses twice a day. He denies any large local reactions from his allergy injections. Since his last office visit he has seen the ENT, Dr. [name], twice. Unfortunately we do not have his office visits at this time for review. He reports that at the first office visit with Dr. [name] he was given a round of prednisone and antibiotics that seemed to clear everything up with the first 5 days but then symptoms would return. He then saw Dr. [name] at the end of last week and he is wanting him to try a nasal rinse with a steroid and antibiotic twice a day. He will do this for 30 days. He has not started this regimen due to the pharmacy not having it, but he hopes to have these medications by the end of the week. He will then follow-up with Dr. [name] around the first or second week of January. He reports postnasal drip, little bit of rhinorrhea that can vary from creamy tan to dark brown with a greenish tent, and some nasal congestion. He denies any sinus tenderness, fever, or chills.  Wheezing is reported as feeling like it is coming from his throat. He also reports he will cough when he has to clear his throat. He denies any tightness in his chest, shortness of breath and nocturnal awakenings. Since his last office visit he  denies any trips to the emergency room or urgent care due to breathing problems. He has been on 1 round of steroids since his last office visit for his sinuses. He has used his albuterol a few times, but reports it does not seem to help. Discussed with Seth Lowery that the wheezing sound he is hearing is more than likely coming from his throat and the post nasal drip he is having.  Drug Allergies:  No Known Allergies  Review of Systems: Review of Systems  Constitutional: Negative for chills and fever.  HENT: Positive for congestion. Negative for sinus pain.        Reports post nasal drip, nasal congestion, and rhinorrhea that can vary in color form creamy tan to darker brown with greenish tints  Eyes:       Denies itchy watery eyes  Respiratory: Negative for cough, shortness of breath and wheezing.   Cardiovascular: Negative for chest pain and palpitations.  Gastrointestinal: Negative for abdominal pain and heartburn.  Genitourinary: Negative for dysuria.  Skin: Negative for itching and rash.  Neurological: Negative for headaches.  Endo/Heme/Allergies: Positive for environmental allergies.    Physical Exam: BP 130/82   Pulse 86   Temp 98 F (36.7 C) (Temporal)   Resp 18   SpO2 95%    Physical Exam Constitutional:      Appearance: Normal appearance.  HENT:     Head: Normocephalic and atraumatic.     Comments: Pharynx  slightly erythematous. Eyes normal. Ears normal. Nose mildly edematous with no drainage noted    Right Ear: Tympanic membrane, ear canal and external ear normal.     Left Ear: Tympanic membrane, ear canal and external ear normal.     Mouth/Throat:     Mouth: Mucous membranes are moist.     Pharynx: Oropharynx is clear.  Eyes:     Conjunctiva/sclera: Conjunctivae normal.  Cardiovascular:     Rate and Rhythm: Normal rate and regular rhythm.     Heart sounds: Normal heart sounds.  Pulmonary:     Effort: Pulmonary effort is normal.     Breath sounds: Normal breath  sounds.     Comments: Lungs clear to auscultation Musculoskeletal:     Cervical back: Neck supple.  Skin:    General: Skin is warm.  Neurological:     Mental Status: He is alert and oriented to person, place, and time.  Psychiatric:        Mood and Affect: Mood normal.        Behavior: Behavior normal.        Thought Content: Thought content normal.        Judgment: Judgment normal.    Diagnostics: None   Assessment and Plan: 1. Seasonal and perennial allergic rhinitis   2. Cough, persistent   3. Wheezing     Meds ordered this encounter  Medications  . levocetirizine (XYZAL) 5 MG tablet    Sig: Take 1 tablet (5 mg total) by mouth daily as needed for allergies.    Dispense:  30 tablet    Refill:  5  . azelastine (ASTELIN) 0.1 % nasal spray    Sig: 1-2 sprays each nostril twice a day as needed for drainage    Dispense:  30 mL    Refill:  5    Patient Instructions  Seasonal and perennial allergic rhinitis Continue allergy injections per protocol Continue Xyzal 5 mg once a day  Stop azelastine/fluticasone nasal spray when you start steroid/antibiotic saline rinse from Dr. [name] Start azelastine nasal spray using 1-2 sprays each nostril twice a day as needed for drainage  Cough persistent Continue to follow up with ENT Dr. [name] and follow his plan- chronic cough  Wheezing Continue albuterol 2 puffs every 4 hours as needed for cough, wheeze, tightness in chest or shortness of breath.  Please let us know if this treatment plan is not working well for you Schedule a follow up appointment in 4 months   Return in about 4 months (around 12/22/2020), or if symptoms worsen or fail to improve.    Thank you for the opportunity to care for this patient.  Please do not hesitate to contact me with questions.  Althea Charon, FNP [facility]  ________________________________________________  I have provided oversight concerning Altha Harm Demichael Traum's  evaluation and treatment of this patient's health issues addressed during today's encounter.  I agree with the assessment and therapeutic plan as outlined in the note.   Signed,   R Edgar Frisk, MD

## 2020-08-29 ENCOUNTER — Ambulatory Visit (INDEPENDENT_AMBULATORY_CARE_PROVIDER_SITE_OTHER): Payer: Commercial Managed Care - PPO

## 2020-08-29 DIAGNOSIS — J309 Allergic rhinitis, unspecified: Secondary | ICD-10-CM

## 2020-09-05 ENCOUNTER — Ambulatory Visit (INDEPENDENT_AMBULATORY_CARE_PROVIDER_SITE_OTHER): Payer: Commercial Managed Care - PPO

## 2020-09-05 DIAGNOSIS — J309 Allergic rhinitis, unspecified: Secondary | ICD-10-CM

## 2020-09-21 ENCOUNTER — Ambulatory Visit (INDEPENDENT_AMBULATORY_CARE_PROVIDER_SITE_OTHER): Payer: Commercial Managed Care - PPO | Admitting: *Deleted

## 2020-09-21 DIAGNOSIS — J309 Allergic rhinitis, unspecified: Secondary | ICD-10-CM | POA: Diagnosis not present

## 2020-09-23 ENCOUNTER — Other Ambulatory Visit: Payer: Self-pay | Admitting: Otolaryngology

## 2020-09-23 DIAGNOSIS — J329 Chronic sinusitis, unspecified: Secondary | ICD-10-CM

## 2020-09-24 ENCOUNTER — Other Ambulatory Visit: Payer: Self-pay | Admitting: Allergy and Immunology

## 2020-09-26 ENCOUNTER — Ambulatory Visit (INDEPENDENT_AMBULATORY_CARE_PROVIDER_SITE_OTHER): Payer: Commercial Managed Care - PPO

## 2020-09-26 DIAGNOSIS — J309 Allergic rhinitis, unspecified: Secondary | ICD-10-CM | POA: Diagnosis not present

## 2020-10-02 ENCOUNTER — Other Ambulatory Visit: Payer: Self-pay | Admitting: Family Medicine

## 2020-10-02 DIAGNOSIS — R7989 Other specified abnormal findings of blood chemistry: Secondary | ICD-10-CM

## 2020-10-07 ENCOUNTER — Ambulatory Visit: Payer: Commercial Managed Care - PPO | Admitting: Family Medicine

## 2020-10-10 ENCOUNTER — Other Ambulatory Visit: Payer: Self-pay

## 2020-10-10 ENCOUNTER — Ambulatory Visit
Admission: RE | Admit: 2020-10-10 | Discharge: 2020-10-10 | Disposition: A | Discharge status: Home / Self Care | Payer: Commercial Managed Care - PPO | Source: Ambulatory Visit | Attending: Otolaryngology | Admitting: Otolaryngology

## 2020-10-10 DIAGNOSIS — J329 Chronic sinusitis, unspecified: Secondary | ICD-10-CM

## 2020-10-12 ENCOUNTER — Ambulatory Visit (INDEPENDENT_AMBULATORY_CARE_PROVIDER_SITE_OTHER): Payer: Commercial Managed Care - PPO

## 2020-10-12 DIAGNOSIS — J309 Allergic rhinitis, unspecified: Secondary | ICD-10-CM

## 2020-10-17 ENCOUNTER — Ambulatory Visit (INDEPENDENT_AMBULATORY_CARE_PROVIDER_SITE_OTHER): Payer: Commercial Managed Care - PPO

## 2020-10-17 DIAGNOSIS — J309 Allergic rhinitis, unspecified: Secondary | ICD-10-CM | POA: Diagnosis not present

## 2020-10-21 ENCOUNTER — Other Ambulatory Visit: Payer: Self-pay | Admitting: Otolaryngology

## 2020-10-24 ENCOUNTER — Ambulatory Visit (INDEPENDENT_AMBULATORY_CARE_PROVIDER_SITE_OTHER): Payer: Commercial Managed Care - PPO

## 2020-10-24 DIAGNOSIS — J309 Allergic rhinitis, unspecified: Secondary | ICD-10-CM

## 2020-10-31 ENCOUNTER — Ambulatory Visit: Payer: Commercial Managed Care - PPO | Admitting: Family Medicine

## 2020-10-31 ENCOUNTER — Other Ambulatory Visit: Payer: Self-pay

## 2020-10-31 ENCOUNTER — Encounter (HOSPITAL_BASED_OUTPATIENT_CLINIC_OR_DEPARTMENT_OTHER): Payer: Self-pay | Admitting: Otolaryngology

## 2020-11-01 ENCOUNTER — Ambulatory Visit (INDEPENDENT_AMBULATORY_CARE_PROVIDER_SITE_OTHER): Payer: Commercial Managed Care - PPO

## 2020-11-01 DIAGNOSIS — J309 Allergic rhinitis, unspecified: Secondary | ICD-10-CM | POA: Diagnosis not present

## 2020-11-03 ENCOUNTER — Other Ambulatory Visit (HOSPITAL_COMMUNITY)
Admission: RE | Admit: 2020-11-03 | Discharge: 2020-11-03 | Disposition: A | Discharge status: Home / Self Care | Payer: Commercial Managed Care - PPO | Source: Ambulatory Visit | Attending: Otolaryngology | Admitting: Otolaryngology

## 2020-11-03 DIAGNOSIS — Z01812 Encounter for preprocedural laboratory examination: Secondary | ICD-10-CM | POA: Insufficient documentation

## 2020-11-03 DIAGNOSIS — Z20822 Contact with and (suspected) exposure to covid-19: Secondary | ICD-10-CM | POA: Diagnosis not present

## 2020-11-03 LAB — SARS CORONAVIRUS 2 (TAT 6-24 HRS): SARS Coronavirus 2: NEGATIVE

## 2020-11-07 ENCOUNTER — Encounter (HOSPITAL_BASED_OUTPATIENT_CLINIC_OR_DEPARTMENT_OTHER)
Admission: RE | Disposition: A | Discharge status: Home / Self Care | Payer: Self-pay | Source: Home / Self Care | Attending: Otolaryngology

## 2020-11-07 ENCOUNTER — Other Ambulatory Visit: Payer: Self-pay

## 2020-11-07 ENCOUNTER — Encounter (HOSPITAL_BASED_OUTPATIENT_CLINIC_OR_DEPARTMENT_OTHER): Payer: Self-pay | Admitting: Otolaryngology

## 2020-11-07 ENCOUNTER — Ambulatory Visit (HOSPITAL_BASED_OUTPATIENT_CLINIC_OR_DEPARTMENT_OTHER)
Admission: RE | Admit: 2020-11-07 | Discharge: 2020-11-07 | Disposition: A | Discharge status: Home / Self Care | Payer: Commercial Managed Care - PPO | Attending: Otolaryngology | Admitting: Otolaryngology

## 2020-11-07 ENCOUNTER — Ambulatory Visit (HOSPITAL_BASED_OUTPATIENT_CLINIC_OR_DEPARTMENT_OTHER): Payer: Commercial Managed Care - PPO | Admitting: Anesthesiology

## 2020-11-07 DIAGNOSIS — J338 Other polyp of sinus: Secondary | ICD-10-CM | POA: Insufficient documentation

## 2020-11-07 DIAGNOSIS — J324 Chronic pansinusitis: Secondary | ICD-10-CM | POA: Diagnosis not present

## 2020-11-07 HISTORY — PX: SPHENOIDECTOMY: SHX2421

## 2020-11-07 HISTORY — PX: SINUS ENDO WITH FUSION: SHX5329

## 2020-11-07 HISTORY — PX: FRONTAL SINUS EXPLORATION: SHX6591

## 2020-11-07 HISTORY — PX: MAXILLARY ANTROSTOMY: SHX2003

## 2020-11-07 HISTORY — PX: ETHMOIDECTOMY: SHX5197

## 2020-11-07 SURGERY — SURGERY, PARANASAL SINUS, ENDOSCOPIC, WITH NASAL SEPTOPLASTY, TURBINOPLASTY, AND MAXILLARY SINUSOTOMY
Anesthesia: General | Site: Nose | Laterality: Bilateral

## 2020-11-07 MED ORDER — PROPOFOL 10 MG/ML IV BOLUS
INTRAVENOUS | Status: AC
  Filled 2020-11-07: qty 40

## 2020-11-07 MED ORDER — PROPOFOL 10 MG/ML IV BOLUS
INTRAVENOUS | Status: DC | PRN
  Administered 2020-11-07: 200 mg via INTRAVENOUS

## 2020-11-07 MED ORDER — SODIUM CHLORIDE 0.9 % IV SOLN
INTRAVENOUS | Status: DC | PRN
  Administered 2020-11-07: 500 mL

## 2020-11-07 MED ORDER — MEPERIDINE HCL 25 MG/ML IJ SOLN: 6.2500 mg | INTRAMUSCULAR | Status: DC | PRN

## 2020-11-07 MED ORDER — ROCURONIUM BROMIDE 100 MG/10ML IV SOLN
INTRAVENOUS | Status: DC | PRN
  Administered 2020-11-07: 60 mg via INTRAVENOUS

## 2020-11-07 MED ORDER — OXYCODONE HCL 5 MG PO TABS: 5.0000 mg | ORAL_TABLET | Freq: Once | ORAL | Status: DC | PRN

## 2020-11-07 MED ORDER — DEXAMETHASONE SODIUM PHOSPHATE 10 MG/ML IJ SOLN
INTRAMUSCULAR | Status: DC | PRN
  Administered 2020-11-07: 10 mg via INTRAVENOUS

## 2020-11-07 MED ORDER — ROCURONIUM BROMIDE 10 MG/ML (PF) SYRINGE
PREFILLED_SYRINGE | INTRAVENOUS | Status: AC
  Filled 2020-11-07: qty 10

## 2020-11-07 MED ORDER — ACETAMINOPHEN 500 MG PO TABS: 1000.0000 mg | ORAL_TABLET | Freq: Once | ORAL | Status: DC

## 2020-11-07 MED ORDER — LIDOCAINE-EPINEPHRINE 1 %-1:100000 IJ SOLN
INTRAMUSCULAR | Status: AC
  Filled 2020-11-07: qty 2

## 2020-11-07 MED ORDER — LIDOCAINE 2% (20 MG/ML) 5 ML SYRINGE
INTRAMUSCULAR | Status: DC | PRN
  Administered 2020-11-07: 60 mg via INTRAVENOUS

## 2020-11-07 MED ORDER — SODIUM CHLORIDE 0.9 % IV SOLN
INTRAVENOUS | Status: AC
  Filled 2020-11-07: qty 10

## 2020-11-07 MED ORDER — OXYCODONE-ACETAMINOPHEN 5-325 MG PO TABS: 1.0000 | ORAL_TABLET | ORAL | 0 refills | Status: AC | PRN

## 2020-11-07 MED ORDER — COCAINE HCL 4 % EX SOLN
CUTANEOUS | Status: AC
  Filled 2020-11-07: qty 4

## 2020-11-07 MED ORDER — LIDOCAINE 2% (20 MG/ML) 5 ML SYRINGE
INTRAMUSCULAR | Status: AC
  Filled 2020-11-07: qty 5

## 2020-11-07 MED ORDER — SUGAMMADEX SODIUM 200 MG/2ML IV SOLN
INTRAVENOUS | Status: DC | PRN
  Administered 2020-11-07: 250 mg via INTRAVENOUS

## 2020-11-07 MED ORDER — OXYMETAZOLINE HCL 0.05 % NA SOLN
NASAL | Status: DC | PRN
  Administered 2020-11-07: 1 via TOPICAL

## 2020-11-07 MED ORDER — LACTATED RINGERS IV SOLN: INTRAVENOUS | Status: DC

## 2020-11-07 MED ORDER — FENTANYL CITRATE (PF) 100 MCG/2ML IJ SOLN
INTRAMUSCULAR | Status: AC
  Filled 2020-11-07: qty 2

## 2020-11-07 MED ORDER — PROMETHAZINE HCL 25 MG/ML IJ SOLN: 6.2500 mg | INTRAMUSCULAR | Status: DC | PRN

## 2020-11-07 MED ORDER — OXYMETAZOLINE HCL 0.05 % NA SOLN
NASAL | Status: AC
  Filled 2020-11-07: qty 60

## 2020-11-07 MED ORDER — CEFAZOLIN SODIUM-DEXTROSE 2-4 GM/100ML-% IV SOLN
INTRAVENOUS | Status: AC
  Filled 2020-11-07: qty 100

## 2020-11-07 MED ORDER — ONDANSETRON HCL 4 MG/2ML IJ SOLN
INTRAMUSCULAR | Status: AC
  Filled 2020-11-07: qty 2

## 2020-11-07 MED ORDER — MUPIROCIN 2 % EX OINT
TOPICAL_OINTMENT | CUTANEOUS | Status: DC | PRN
  Administered 2020-11-07: 1 via TOPICAL

## 2020-11-07 MED ORDER — MIDAZOLAM HCL 5 MG/5ML IJ SOLN
INTRAMUSCULAR | Status: DC | PRN
  Administered 2020-11-07: 2 mg via INTRAVENOUS

## 2020-11-07 MED ORDER — LEVOFLOXACIN 750 MG PO TABS: 750.0000 mg | ORAL_TABLET | Freq: Every day | ORAL | 0 refills | Status: AC

## 2020-11-07 MED ORDER — COCAINE HCL 4 % EX SOLN
CUTANEOUS | Status: DC | PRN
  Administered 2020-11-07: 4 mL via TOPICAL

## 2020-11-07 MED ORDER — HYDROMORPHONE HCL 1 MG/ML IJ SOLN
0.2500 mg | INTRAMUSCULAR | Status: DC | PRN
Start: 2020-11-07 — End: 2020-11-07

## 2020-11-07 MED ORDER — MUPIROCIN 2 % EX OINT
TOPICAL_OINTMENT | CUTANEOUS | Status: AC
  Filled 2020-11-07: qty 44

## 2020-11-07 MED ORDER — FENTANYL CITRATE (PF) 100 MCG/2ML IJ SOLN
INTRAMUSCULAR | Status: DC | PRN
  Administered 2020-11-07: 100 ug via INTRAVENOUS

## 2020-11-07 MED ORDER — MIDAZOLAM HCL 2 MG/2ML IJ SOLN
INTRAMUSCULAR | Status: AC
  Filled 2020-11-07: qty 2

## 2020-11-07 MED ORDER — OXYCODONE HCL 5 MG/5ML PO SOLN: 5.0000 mg | Freq: Once | ORAL | Status: DC | PRN

## 2020-11-07 MED ORDER — ONDANSETRON HCL 4 MG/2ML IJ SOLN
INTRAMUSCULAR | Status: DC | PRN
  Administered 2020-11-07: 4 mg via INTRAVENOUS

## 2020-11-07 MED ORDER — DEXAMETHASONE SODIUM PHOSPHATE 10 MG/ML IJ SOLN
INTRAMUSCULAR | Status: AC
  Filled 2020-11-07: qty 1

## 2020-11-07 MED ORDER — SUGAMMADEX SODIUM 500 MG/5ML IV SOLN
INTRAVENOUS | Status: AC
  Filled 2020-11-07: qty 5

## 2020-11-07 MED ORDER — CEFAZOLIN SODIUM-DEXTROSE 2-3 GM-%(50ML) IV SOLR
INTRAVENOUS | Status: DC | PRN
  Administered 2020-11-07: 2 g via INTRAVENOUS

## 2020-11-07 SURGICAL SUPPLY — 53 items
BAG DECANTER FOR FLEXI CONT (MISCELLANEOUS) ×2 IMPLANT
BLADE RAD40 ROTATE 4M 4 5PK (BLADE) IMPLANT
BLADE RAD60 ROTATE M4 4 5PK (BLADE) IMPLANT
BLADE ROTATE RAD 12 4 M4 (BLADE) IMPLANT
BLADE ROTATE RAD 40 4 M4 (BLADE) IMPLANT
BLADE ROTATE TRICUT 4X13 M4 (BLADE) ×2 IMPLANT
BLADE TRICUT ROTATE M4 4 5PK (BLADE) IMPLANT
BUR HS RAD FRONTAL 3 (BURR) IMPLANT
CANISTER SUC SOCK COL 7IN (MISCELLANEOUS) ×2 IMPLANT
CANISTER SUCT 1200ML W/VALVE (MISCELLANEOUS) ×4 IMPLANT
COAGULATOR SUCT 8FR VV (MISCELLANEOUS) IMPLANT
COVER WAND RF STERILE (DRAPES) IMPLANT
DECANTER SPIKE VIAL GLASS SM (MISCELLANEOUS) IMPLANT
DRSG NASAL KENNEDY LMNT 8CM (GAUZE/BANDAGES/DRESSINGS) IMPLANT
DRSG NASOPORE 8CM (GAUZE/BANDAGES/DRESSINGS) ×4 IMPLANT
DRSG TELFA 3X8 NADH (GAUZE/BANDAGES/DRESSINGS) IMPLANT
ELECT REM PT RETURN 9FT ADLT (ELECTROSURGICAL) ×2
ELECTRODE REM PT RTRN 9FT ADLT (ELECTROSURGICAL) ×1 IMPLANT
GLOVE SURG ENC MOIS LTX SZ6.5 (GLOVE) ×2 IMPLANT
GLOVE SURG ENC MOIS LTX SZ7 (GLOVE) ×2 IMPLANT
GLOVE SURG ENC MOIS LTX SZ7.5 (GLOVE) ×2 IMPLANT
GLOVE SURG UNDER POLY LF SZ6.5 (GLOVE) ×2 IMPLANT
GLOVE SURG UNDER POLY LF SZ7 (GLOVE) ×2 IMPLANT
GOWN STRL REUS W/ TWL LRG LVL3 (GOWN DISPOSABLE) ×2 IMPLANT
GOWN STRL REUS W/ TWL XL LVL3 (GOWN DISPOSABLE) ×1 IMPLANT
GOWN STRL REUS W/TWL LRG LVL3 (GOWN DISPOSABLE) ×2
GOWN STRL REUS W/TWL XL LVL3 (GOWN DISPOSABLE) ×1
HEMOSTAT SURGICEL 2X14 (HEMOSTASIS) IMPLANT
IV NS 500ML (IV SOLUTION) ×1
IV NS 500ML BAXH (IV SOLUTION) ×1 IMPLANT
NEEDLE HYPO 25X1 1.5 SAFETY (NEEDLE) IMPLANT
NEEDLE SPNL 25GX3.5 QUINCKE BL (NEEDLE) IMPLANT
NS IRRIG 1000ML POUR BTL (IV SOLUTION) ×2 IMPLANT
PACK BASIN DAY SURGERY FS (CUSTOM PROCEDURE TRAY) ×2 IMPLANT
PACK ENT DAY SURGERY (CUSTOM PROCEDURE TRAY) ×2 IMPLANT
SLEEVE SCD COMPRESS KNEE MED (MISCELLANEOUS) ×2 IMPLANT
SOLUTION BUTLER CLEAR DIP (MISCELLANEOUS) ×2 IMPLANT
SPLINT NASAL AIRWAY SILICONE (MISCELLANEOUS) IMPLANT
SPONGE GAUZE 2X2 8PLY STRL LF (GAUZE/BANDAGES/DRESSINGS) ×2 IMPLANT
SPONGE NEURO XRAY DETECT 1X3 (DISPOSABLE) ×4 IMPLANT
SUCTION FRAZIER HANDLE 10FR (MISCELLANEOUS)
SUCTION TUBE FRAZIER 10FR DISP (MISCELLANEOUS) IMPLANT
SUT CHROMIC 4 0 P 3 18 (SUTURE) IMPLANT
SUT PLAIN 4 0 ~~LOC~~ 1 (SUTURE) IMPLANT
SUT PROLENE 3 0 PS 2 (SUTURE) IMPLANT
SYR 50ML LL SCALE MARK (SYRINGE) ×2 IMPLANT
TOWEL GREEN STERILE FF (TOWEL DISPOSABLE) ×2 IMPLANT
TRACKER ENT INSTRUMENT (MISCELLANEOUS) ×2 IMPLANT
TRACKER ENT PATIENT (MISCELLANEOUS) ×2 IMPLANT
TUBE CONNECTING 20X1/4 (TUBING) ×2 IMPLANT
TUBE SALEM SUMP 16 FR W/ARV (TUBING) ×2 IMPLANT
TUBING STRAIGHTSHOT EPS 5PK (TUBING) ×2 IMPLANT
YANKAUER SUCT BULB TIP NO VENT (SUCTIONS) ×2 IMPLANT

## 2020-11-07 NOTE — Anesthesia Preprocedure Evaluation (Addendum)
Anesthesia Evaluation  Patient identified by MRN, date of birth, ID band Patient awake    Reviewed: Allergy & Precautions, NPO status , Patient's Chart, lab work & pertinent test results  Airway Mallampati: III  TM Distance: >3 FB Neck ROM: Full    Dental no notable dental hx. (+) Teeth Intact, Dental Advisory Given   Pulmonary neg pulmonary ROS,    Pulmonary exam normal breath sounds clear to auscultation       Cardiovascular negative cardio ROS Normal cardiovascular exam Rhythm:Regular Rate:Normal     Neuro/Psych negative neurological ROS  negative psych ROS   GI/Hepatic negative GI ROS, Neg liver ROS,   Endo/Other  Obesity BMI 32  Renal/GU negative Renal ROS  negative genitourinary   Musculoskeletal negative musculoskeletal ROS (+)   Abdominal   Peds  Hematology negative hematology ROS (+)   Anesthesia Other Findings Recurrent sinusitis   Reproductive/Obstetrics negative OB ROS                            Anesthesia Physical Anesthesia Plan  ASA: I  Anesthesia Plan: General   Post-op Pain Management:    Induction: Intravenous  PONV Risk Score and Plan: 3 and Ondansetron, Dexamethasone, Midazolam and Treatment may vary due to age or medical condition  Airway Management Planned: Oral ETT  Additional Equipment: None  Intra-op Plan:   Post-operative Plan: Extubation in OR  Informed Consent: I have reviewed the patients History and Physical, chart, labs and discussed the procedure including the risks, benefits and alternatives for the proposed anesthesia with the patient or authorized representative who has indicated his/her understanding and acceptance.     Dental advisory given  Plan Discussed with: CRNA  Anesthesia Plan Comments:         Anesthesia Quick Evaluation

## 2020-11-07 NOTE — Anesthesia Procedure Notes (Signed)
Procedure Name: Intubation Date/Time: 11/07/2020 7:41 AM Performed by: Lavonia Dana, CRNA Pre-anesthesia Checklist: Patient identified, Emergency Drugs available, Suction available and Patient being monitored Patient Re-evaluated:Patient Re-evaluated prior to induction Oxygen Delivery Method: Circle system utilized Preoxygenation: Pre-oxygenation with 100% oxygen Induction Type: IV induction Ventilation: Mask ventilation without difficulty Laryngoscope Size: Mac and 4 Grade View: Grade I Tube type: Oral Tube size: 7.5 mm Number of attempts: 1 Airway Equipment and Method: Stylet and Oral airway Placement Confirmation: ETT inserted through vocal cords under direct vision,  positive ETCO2 and breath sounds checked- equal and bilateral Secured at: 23 cm Tube secured with: Tape Dental Injury: Teeth and Oropharynx as per pre-operative assessment

## 2020-11-07 NOTE — H&P (Signed)
Cc: Chronic rhinosinusitis  HPI: The patient is a 50 year old male who returns today for follow-up evaluation of his chronic rhinosinusitis and chronic cough.  The patient was last seen 3 weeks ago.  At that time, he was noted to have persistent mucopurulent drainage in both nasal cavities.  His sinus openings were edematous and erythematous.  The patient has been treated with multiple courses of extended antibiotics, including recent Levaquin and high dose Prednisone.  He was also treated with antibiotics/steroid nasal irrigation (mupirocin/mometasone).  The patient was continued on his weekly immunotherapy and daily dymista nasal spray.  He recently underwent a sinus CT scan.  The CT scan showed persistent bilateral moderate to severe pansinusitis including all 4 pairs of paranasal sinuses.  Fluid levels were also noted in his maxillary sinuses bilaterally.  The patient returns today complaining of persistent symptoms.  He continues to have mucopurulent drainage, facial pressure and frequent coughing spells.  He also complains of increased muffled hearing on the left side.   Exam: The flexible scope was inserted into the right nasal cavity. Endoscopy of the interior nasal cavity, superior, inferior, and middle meatus was performed. The sphenoid-ethmoid recess was examined. Sinus openings patent with mucopurulent drainage. Edematous and erythematous mucosa was noted. Olfactory cleft was clear. Nasopharynx was clear. Turbinates were hypertrophied but without mass.  The procedure was repeated on the contralateral side with NSD. The patient tolerated the procedure well. Instructions were given to avoid eating or drinking for 2 hours.  Assessment: 1.  Bilateral chronic pansinusitis, with mucopurulent drainage bilaterally.  2.  The patient's recent CT scan showed moderate to severe mucosal disease throughout his paranasal sinuses.    Plan:   1.  The nasal endoscopy findings and the CT images are reviewed  with the patient.   2.  The patient should continue with mupirocin/mometasone nasal irrigation.  3.  Continue with allergy treatment regimen.   4.  Based on his persistent symptoms, he will benefit from undergoing surgical intervention with bilateral endoscopic sinus surgery.  The risks, benefits, alternatives and details of the procedure are reviewed with the patient.  Questions are invited and answered.  5.  The patient would like to proceed with the procedures.

## 2020-11-07 NOTE — Op Note (Signed)
DATE OF PROCEDURE: 11/07/2020  OPERATIVE REPORT   SURGEON: Leta Baptist, MD   PREOPERATIVE DIAGNOSES:  1. Bilateral chronic pansinusitis and polyposis.  POSTOPERATIVE DIAGNOSES:  1. Bilateral chronic pansinusitis and polyposis.  PROCEDURE PERFORMED:  1.  Bilateral endoscopic total ethmoidectomy and sphenoidotomy with tissue removal. 2.  Bilateral endoscopic frontal sinusotomy with polyp removal. 3.  Bilateral endoscopic maxillary antrostomy with polyp removal. 4.  FUSION stereotactic image guidance.  ANESTHESIA: General endotracheal tube anesthesia.   COMPLICATIONS: None.   ESTIMATED BLOOD LOSS: 300 mL.   INDICATION FOR PROCEDURE: Seth Lowery is a 50 y.o. male with a history of bilateral chronic rhinosinusitis.  At his last visit, he was noted to have by persistent mucopurulent drainage in both nasal cavities.  His sinus openings were edematous and erythematous.  The patient has been treated with multiple courses of extended antibiotics, including recent Levaquin and high dose Prednisone.  He was also treated with antibiotics/steroid nasal irrigation (mupirocin/mometasone).  The patient was continued on his weekly immunotherapy and daily dymista nasal spray.  He recently underwent a sinus CT scan.  The CT scan showed persistent bilateral moderate to severe pansinusitis including all 4 pairs of paranasal sinuses.  Fluid levels were also noted in his maxillary sinuses bilaterally. Based on the above findings, the decision was made for the patient to undergo the above-stated procedures. The risks, benefits, alternatives, and details of the procedures were discussed with the patient. Questions were invited and answered. Informed consent was obtained.   DESCRIPTION OF PROCEDURE: The patient was taken to the operating room and placed supine on the operating table. General endotracheal tube anesthesia was administered by the anesthesiologist. The patient was positioned, and prepped and draped in the  standard fashion for nasal surgery. Pledgets soaked with Afrin were placed in both nasal cavities for decongestion. The pledgets were subsequently removed. The FUSION stereotactic image guidance marker was placed. The image guidance system was functional throughout the case.  Using a 0 endoscope, the left nasal cavity was examined.  A large amount of mucopurulent drainage was noted within the nasal cavity.  The left middle turbinate was medialized.  Polypoid tissue was noted within the middle meatus. The polypoid tissue was removed using a combination of microdebrider and Blakesley forceps. The maxillary antrum was entered and enlarged using a combination of backbiter and microdebrider. Polypoid tissue was removed from the left maxillary sinus.  Attention was then focused on the ethmoid sinuses. The bony partitions of the anterior and posterior ethmoid cavities were taken down. Polypoid tissue was noted and removed.  More polyps were noted to obstruct the left sphenoid opening. The polyps were removed. The sphenoid opening was entered and enlarged. More polypoid tissue was removed from within the sphenoid sinus. Attention was then focused on the frontal sinus. The frontal recess was identified and enlarged by removing the surrounding bony partitions. Polypoid tissue was removed from the frontal recess. All 4 paranasal sinuses were copiously irrigated with antibiotic solution.  The same procedure was repeated on the right side without exception. More polyps were noted on the right side. All polyps were removed.   The care of the patient was turned over to the anesthesiologist. The patient was awakened from anesthesia without difficulty. The patient was extubated and transferred to the recovery room in good condition.   OPERATIVE FINDINGS: Bilateral chronic pansinusitis and polyposis.  SPECIMEN: Bilateral sinus contents.  FOLLOWUP CARE: The patient be discharged home once he is awake and alert. The  patient will be  placed on Percocet p.r.n. pain, and levofloxacin for 7 days. The patient will follow up in my office in 4 days for sinus debridement.  Jeno Calleros Raynelle Bring, MD

## 2020-11-07 NOTE — Transfer of Care (Signed)
Immediate Anesthesia Transfer of Care Note  Patient: Seth Lowery  Procedure(s) Performed: SINUS ENDOSCOPY WITH FUSION NAVIGATION (Bilateral Nose) ENDOSCOPIC MAXILLARY ANTROSTOMY WITH TISSUE REMOVAL (Bilateral Nose) ENDOSCOPIC FRONTAL RECESS SINUS EXPLORATION (Bilateral Nose) ENDOSCOPIC ETHMOIDECTOMY (Bilateral Nose) ENDOSCOPIC SPHENOIDECTOMY WITH TISSUE REMOVAL (Bilateral Nose)  Patient Location: PACU  Anesthesia Type:General  Level of Consciousness: awake, alert  and oriented  Airway & Oxygen Therapy: Patient Spontanous Breathing and Patient connected to face mask oxygen  Post-op Assessment: Report given to RN and Post -op Vital signs reviewed and stable  Post vital signs: Reviewed and stable  Last Vitals:  Vitals Value Taken Time  BP 145/92 11/07/20 0938  Temp    Pulse 99 11/07/20 0940  Resp 15 11/07/20 0940  SpO2 95 % 11/07/20 0940  Vitals shown include unvalidated device data.  Last Pain:  Vitals:   11/07/20 0630  TempSrc: Oral  PainSc: 0-No pain         Complications: No complications documented.

## 2020-11-07 NOTE — Discharge Instructions (Addendum)

## 2020-11-07 NOTE — Anesthesia Postprocedure Evaluation (Signed)
Anesthesia Post Note  Patient: Seth Lowery  Procedure(s) Performed: SINUS ENDOSCOPY WITH FUSION NAVIGATION (Bilateral Nose) ENDOSCOPIC MAXILLARY ANTROSTOMY WITH TISSUE REMOVAL (Bilateral Nose) ENDOSCOPIC FRONTAL RECESS SINUS EXPLORATION (Bilateral Nose) ENDOSCOPIC ETHMOIDECTOMY (Bilateral Nose) ENDOSCOPIC SPHENOIDECTOMY WITH TISSUE REMOVAL (Bilateral Nose)     Patient location during evaluation: PACU Anesthesia Type: General Level of consciousness: awake and alert Pain management: pain level controlled Vital Signs Assessment: post-procedure vital signs reviewed and stable Respiratory status: spontaneous breathing, nonlabored ventilation and respiratory function stable Cardiovascular status: blood pressure returned to baseline and stable Postop Assessment: no apparent nausea or vomiting Anesthetic complications: no   No complications documented.  Last Vitals:  Vitals:   11/07/20 1000 11/07/20 1025  BP: (!) 120/48 (!) 147/58  Pulse: 94 83  Resp: 13 16  Temp:  37.2 C  SpO2: 93% 93%    Last Pain:  Vitals:   11/07/20 1025  TempSrc:   PainSc: 0-No pain                 Lynda Rainwater

## 2020-11-08 ENCOUNTER — Encounter (HOSPITAL_BASED_OUTPATIENT_CLINIC_OR_DEPARTMENT_OTHER): Payer: Self-pay | Admitting: Otolaryngology

## 2020-11-08 LAB — SURGICAL PATHOLOGY

## 2020-11-10 ENCOUNTER — Ambulatory Visit (INDEPENDENT_AMBULATORY_CARE_PROVIDER_SITE_OTHER): Payer: Commercial Managed Care - PPO

## 2020-11-10 DIAGNOSIS — J309 Allergic rhinitis, unspecified: Secondary | ICD-10-CM

## 2020-11-22 ENCOUNTER — Ambulatory Visit (INDEPENDENT_AMBULATORY_CARE_PROVIDER_SITE_OTHER): Payer: Commercial Managed Care - PPO

## 2020-11-22 ENCOUNTER — Ambulatory Visit: Payer: Commercial Managed Care - PPO | Admitting: Family Medicine

## 2020-11-22 DIAGNOSIS — J309 Allergic rhinitis, unspecified: Secondary | ICD-10-CM

## 2020-11-28 ENCOUNTER — Ambulatory Visit (INDEPENDENT_AMBULATORY_CARE_PROVIDER_SITE_OTHER): Payer: Commercial Managed Care - PPO

## 2020-11-28 DIAGNOSIS — J309 Allergic rhinitis, unspecified: Secondary | ICD-10-CM

## 2020-12-05 ENCOUNTER — Ambulatory Visit (INDEPENDENT_AMBULATORY_CARE_PROVIDER_SITE_OTHER): Payer: Commercial Managed Care - PPO

## 2020-12-05 DIAGNOSIS — J309 Allergic rhinitis, unspecified: Secondary | ICD-10-CM

## 2020-12-14 ENCOUNTER — Ambulatory Visit (INDEPENDENT_AMBULATORY_CARE_PROVIDER_SITE_OTHER): Payer: Commercial Managed Care - PPO

## 2020-12-14 DIAGNOSIS — J309 Allergic rhinitis, unspecified: Secondary | ICD-10-CM | POA: Diagnosis not present

## 2020-12-16 ENCOUNTER — Encounter: Payer: Self-pay | Admitting: Family Medicine

## 2020-12-16 ENCOUNTER — Ambulatory Visit: Payer: Commercial Managed Care - PPO | Admitting: Family Medicine

## 2020-12-16 ENCOUNTER — Other Ambulatory Visit: Payer: Self-pay

## 2020-12-16 VITALS — BP 124/85 | HR 66 | Temp 98.6°F | Ht 76.0 in | Wt 269.8 lb

## 2020-12-16 DIAGNOSIS — R0683 Snoring: Secondary | ICD-10-CM

## 2020-12-16 DIAGNOSIS — R062 Wheezing: Secondary | ICD-10-CM

## 2020-12-16 DIAGNOSIS — Z0000 Encounter for general adult medical examination without abnormal findings: Secondary | ICD-10-CM | POA: Diagnosis not present

## 2020-12-16 DIAGNOSIS — D751 Secondary polycythemia: Secondary | ICD-10-CM

## 2020-12-16 DIAGNOSIS — R7989 Other specified abnormal findings of blood chemistry: Secondary | ICD-10-CM

## 2020-12-16 DIAGNOSIS — I10 Essential (primary) hypertension: Secondary | ICD-10-CM | POA: Insufficient documentation

## 2020-12-16 DIAGNOSIS — R5383 Other fatigue: Secondary | ICD-10-CM | POA: Diagnosis not present

## 2020-12-16 LAB — COMPREHENSIVE METABOLIC PANEL
ALT: 44 U/L (ref 0–53)
AST: 25 U/L (ref 0–37)
Albumin: 4.3 g/dL (ref 3.5–5.2)
Alkaline Phosphatase: 69 U/L (ref 39–117)
BUN: 16 mg/dL (ref 6–23)
CO2: 26 mEq/L (ref 19–32)
Calcium: 8.8 mg/dL (ref 8.4–10.5)
Chloride: 104 mEq/L (ref 96–112)
Creatinine, Ser: 1.07 mg/dL (ref 0.40–1.50)
GFR: 81.53 mL/min (ref >60.00)
Glucose, Bld: 95 mg/dL (ref 70–99)
Potassium: 4.1 mEq/L (ref 3.5–5.1)
Sodium: 139 mEq/L (ref 135–145)
Total Bilirubin: 0.8 mg/dL (ref 0.2–1.2)
Total Protein: 6.7 g/dL (ref 6.0–8.3)

## 2020-12-16 LAB — CBC
HCT: 51.3 % (ref 39.0–52.0)
Hemoglobin: 17.3 g/dL — ABNORMAL HIGH (ref 13.0–17.0)
MCHC: 33.8 g/dL (ref 30.0–36.0)
MCV: 84.5 fl (ref 78.0–100.0)
Platelets: 275 10*3/uL (ref 150.0–400.0)
RBC: 6.08 Mil/uL — ABNORMAL HIGH (ref 4.22–5.81)
RDW: 14.8 % (ref 11.5–15.5)
WBC: 9.5 10*3/uL (ref 4.0–10.5)

## 2020-12-16 LAB — URINALYSIS, ROUTINE W REFLEX MICROSCOPIC
Bilirubin Urine: NEGATIVE
Hgb urine dipstick: NEGATIVE
Ketones, ur: NEGATIVE
Leukocytes,Ua: NEGATIVE
Nitrite: NEGATIVE
RBC / HPF: NONE SEEN (ref 0–?)
Specific Gravity, Urine: 1.015 (ref 1.000–1.030)
Total Protein, Urine: NEGATIVE
Urine Glucose: NEGATIVE
Urobilinogen, UA: 0.2 (ref 0.0–1.0)
pH: 6 (ref 5.0–8.0)

## 2020-12-16 LAB — LIPID PANEL
Cholesterol: 181 mg/dL (ref 0–200)
HDL: 51.4 mg/dL (ref >39.00)
LDL Cholesterol: 108 mg/dL — ABNORMAL HIGH (ref 0–99)
NonHDL: 129.4
Total CHOL/HDL Ratio: 4
Triglycerides: 106 mg/dL (ref 0.0–149.0)
VLDL: 21.2 mg/dL (ref 0.0–40.0)

## 2020-12-16 LAB — TESTOSTERONE: Testosterone: 756.51 ng/dL (ref 300.00–890.00)

## 2020-12-16 LAB — TSH: TSH: 1.37 u[IU]/mL (ref 0.35–4.50)

## 2020-12-16 LAB — HEMOGLOBIN A1C: Hgb A1c MFr Bld: 5.6 % (ref 4.6–6.5)

## 2020-12-16 LAB — PSA: PSA: 0.52 ng/mL (ref 0.10–4.00)

## 2020-12-16 NOTE — Progress Notes (Signed)
Established Patient Office Visit  Subjective:  Patient ID: Seth Lowery, male    DOB: [birth year]  Age: 50 y.o. MRN: [id number]  CC:  Chief Complaint  Patient presents with  . Follow-up    Follow visit for resp issues and states it hasnt gotten much better     HPI Seth Lowery presents for follow-up of androgen deficiency and erythrocytosis.  He has been taking the testosterone every other week.  Continues follow-up with allergist for desensitization therapy.  He does snore but feels rested in the morning.  As far as he knows he does not have any apnea episodes.  Status post sinus surgery again for nasal polyposis.  Says that he has been tested for asthma multiple times and the results have been negative.  He does not smoke or drink alcohol.  Past Medical History:  Diagnosis Date  . Recurrent sinusitis     Past Surgical History:  Procedure Laterality Date  . ADENOIDECTOMY    . ETHMOIDECTOMY Bilateral 11/07/2020   Procedure: ENDOSCOPIC ETHMOIDECTOMY;  Surgeon: Leta Baptist, MD;  Location: [facility];  Service: ENT;  Laterality: Bilateral;  . FRONTAL SINUS EXPLORATION Bilateral 11/07/2020   Procedure: ENDOSCOPIC FRONTAL RECESS SINUS EXPLORATION;  Surgeon: Leta Baptist, MD;  Location: [facility];  Service: ENT;  Laterality: Bilateral;  . MAXILLARY ANTROSTOMY Bilateral 11/07/2020   Procedure: ENDOSCOPIC MAXILLARY ANTROSTOMY WITH TISSUE REMOVAL;  Surgeon: Leta Baptist, MD;  Location: [facility];  Service: ENT;  Laterality: Bilateral;  . NASAL SINUS SURGERY  08/05/2018  . SINUS ENDO WITH FUSION Bilateral 11/07/2020   Procedure: SINUS ENDOSCOPY WITH FUSION NAVIGATION;  Surgeon: Leta Baptist, MD;  Location: [facility];  Service: ENT;  Laterality: Bilateral;  . SINUS SURGERY WITH INSTATRAK    . SINUS SURGERY WITH INSTATRAK    . SPHENOIDECTOMY Bilateral 11/07/2020   Procedure: ENDOSCOPIC SPHENOIDECTOMY WITH TISSUE REMOVAL;  Surgeon: Leta Baptist, MD;   Location: [facility];  Service: ENT;  Laterality: Bilateral;  . TONSILLECTOMY      Family History  Problem Relation Age of Onset  . Healthy Mother   . Diabetes Father   . Allergic rhinitis Father   . Angioedema Neg Hx   . Asthma Neg Hx   . Atopy Neg Hx   . Eczema Neg Hx   . Immunodeficiency Neg Hx   . Urticaria Neg Hx     Social History   Socioeconomic History  . Marital status: Married    Spouse name: Not on file  . Number of children: Not on file  . Years of education: Not on file  . Highest education level: Not on file  Occupational History  . Not on file  Tobacco Use  . Smoking status: Never Smoker  . Smokeless tobacco: Never Used  Vaping Use  . Vaping Use: Never used  Substance and Sexual Activity  . Alcohol use: Not Currently  . Drug use: Never  . Sexual activity: Yes    Birth control/protection: Condom  Other Topics Concern  . Not on file  Social History Narrative  . Not on file   Social Determinants of Health   Financial Resource Strain: Not on file  Food Insecurity: Not on file  Transportation Needs: Not on file  Physical Activity: Not on file  Stress: Not on file  Social Connections: Not on file  Intimate Partner Violence: Not on file    Outpatient Medications Prior to Visit  Medication Sig Dispense Refill  .  albuterol (PROAIR HFA) 108 (90 Base) MCG/ACT inhaler Inhale 2 puffs into the lungs every 4 (four) hours as needed for wheezing or shortness of breath. 18 g 1  . azelastine (ASTELIN) 0.1 % nasal spray 1-2 sprays each nostril twice a day as needed for drainage 30 mL 5  . EPINEPHrine (AUVI-Q) 0.3 mg/0.3 mL IJ SOAJ injection Inject 0.3 mg into the muscle as needed for anaphylaxis. 1 each 1  . mupirocin nasal ointment (BACTROBAN) 2 % Place 1 application into the nose 2 (two) times daily. Use one-half of tube in each nostril twice daily for five (5) days. After application, press sides of nose together and gently massage.    Marland Kitchen  testosterone cypionate (DEPOTESTOSTERONE CYPIONATE) 200 MG/ML injection INJECT 1 ML (200 MG TOTAL) INTO THE MUSCLE EVERY 14 (FOURTEEN) DAYS. 2 mL 3  . Azelastine & Fluticasone 137 & 50 MCG/ACT THPK Place 1 spray into the nose 2 (two) times daily as needed. (Patient not taking: Reported on 12/16/2020) 1 each 5   No facility-administered medications prior to visit.    No Known Allergies  ROS Review of Systems  Constitutional: Negative.   HENT: Negative.   Eyes: Negative for photophobia and visual disturbance.  Respiratory: Negative for cough, shortness of breath and wheezing.   Cardiovascular: Negative.   Gastrointestinal: Negative.   Genitourinary: Negative.   Musculoskeletal: Negative.   Neurological: Negative.   Psychiatric/Behavioral: Negative.       Objective:    Physical Exam Vitals and nursing note reviewed.  Constitutional:      General: He is not in acute distress.    Appearance: Normal appearance. He is not ill-appearing, toxic-appearing or diaphoretic.  HENT:     Head: Normocephalic and atraumatic.     Right Ear: Tympanic membrane, ear canal and external ear normal.     Left Ear: Tympanic membrane, ear canal and external ear normal.     Mouth/Throat:     Mouth: Mucous membranes are moist.     Pharynx: Oropharynx is clear. No oropharyngeal exudate or posterior oropharyngeal erythema.   Eyes:     General: No scleral icterus.    Extraocular Movements: Extraocular movements intact.     Conjunctiva/sclera: Conjunctivae normal.     Pupils: Pupils are equal, round, and reactive to light.  Cardiovascular:     Rate and Rhythm: Normal rate and regular rhythm.  Pulmonary:     Effort: Pulmonary effort is normal.     Breath sounds: Wheezing present.  Abdominal:     General: Bowel sounds are normal.  Skin:    General: Skin is warm and dry.  Neurological:     Mental Status: He is alert and oriented to person, place, and time.  Psychiatric:        Mood and Affect: Mood  normal.        Behavior: Behavior normal.     BP 124/85 (BP Location: Left Arm, Patient Position: Sitting, Cuff Size: Normal)   Pulse 66   Temp 98.6 F (37 C) (Temporal)   Ht 6\' 4"  (1.93 m)   Wt 269 lb 12.8 oz (122.4 kg)   SpO2 95%   BMI 32.84 kg/m  Wt Readings from Last 3 Encounters:  12/16/20 269 lb 12.8 oz (122.4 kg)  11/07/20 262 lb 5.6 oz (119 kg)  07/07/20 270 lb (122.5 kg)     Health Maintenance Due  Topic Date Due  . COLONOSCOPY (Pts 45-30yrs Insurance coverage will need to be confirmed)  Never done  .  COVID-19 Vaccine (2 - Booster for Janssen series) 01/25/2020    There are no preventive care reminders to display for this patient.  Lab Results  Component Value Date   TSH 1.63 04/05/2020   Lab Results  Component Value Date   WBC 7.1 07/07/2020   HGB 15.8 07/07/2020   HCT 46.2 07/07/2020   MCV 84.8 07/07/2020   PLT 248.0 07/07/2020   Lab Results  Component Value Date   NA 138 04/05/2020   K 4.7 04/05/2020   CO2 29 04/05/2020   GLUCOSE 100 (H) 04/05/2020   BUN 16 04/05/2020   CREATININE 1.15 04/05/2020   BILITOT 0.8 04/05/2020   ALKPHOS 55 04/05/2020   AST 36 04/05/2020   ALT 50 04/05/2020   PROT 6.4 04/05/2020   ALBUMIN 4.2 04/05/2020   CALCIUM 8.8 04/05/2020   GFR 67.67 04/05/2020   Lab Results  Component Value Date   CHOL 175 04/05/2020   Lab Results  Component Value Date   HDL 42.80 04/05/2020   Lab Results  Component Value Date   LDLCALC 115 (H) 04/05/2020   Lab Results  Component Value Date   TRIG 88.0 04/05/2020   Lab Results  Component Value Date   CHOLHDL 4 04/05/2020   Lab Results  Component Value Date   HGBA1C 5.7 04/05/2020      Assessment & Plan:   Problem List Items Addressed This Visit      Other   Low testosterone - Primary   Relevant Orders   Testosterone   Healthcare maintenance   Relevant Orders   CBC   Comprehensive metabolic panel   Lipid panel   PSA   Urinalysis, Routine w reflex microscopic    Ambulatory referral to Gastroenterology   Hemoglobin A1c   Other erythrocytosis   Relevant Orders   CBC   Wheezing   Relevant Orders   Ambulatory referral to Pulmonology   Fatigue   Relevant Orders   TSH   Snores   Relevant Orders   Ambulatory referral to Pulmonology      No orders of the defined types were placed in this encounter.   Follow-up: Return in about 3 months (around 03/17/2021), or For physical exam..  Would like for him to see the pulmonologist for evaluation of asthma and apnea.  He is wheezing today and has a Mallampati score of 3.  CBC to check for erythrocytosis.  Checking hemoglobin A1c and TSH for additional evaluation of fatigue.  [name], MD

## 2020-12-17 MED ORDER — TESTOSTERONE CYPIONATE 100 MG/ML IJ SOLN
INTRAMUSCULAR | 0 refills | Status: DC
Start: 2020-12-17 — End: 2021-02-09

## 2020-12-17 NOTE — Progress Notes (Signed)
Labs were okay but Hgb has increased again on 200mg  dose of testosterone. Testosterone level is high normal. Would like to try lower dose of testosterone and recheck at July appointment. Stop 200mg  dose and start 100mg  dose.

## 2020-12-17 NOTE — Addendum Note (Signed)
Addended by: Jon Billings on: 12/17/2020 06:05 PM   Modules accepted: Orders

## 2020-12-21 ENCOUNTER — Ambulatory Visit (INDEPENDENT_AMBULATORY_CARE_PROVIDER_SITE_OTHER): Payer: Commercial Managed Care - PPO | Admitting: *Deleted

## 2020-12-21 DIAGNOSIS — J309 Allergic rhinitis, unspecified: Secondary | ICD-10-CM | POA: Diagnosis not present

## 2020-12-26 ENCOUNTER — Ambulatory Visit (INDEPENDENT_AMBULATORY_CARE_PROVIDER_SITE_OTHER): Payer: Commercial Managed Care - PPO

## 2020-12-26 ENCOUNTER — Other Ambulatory Visit: Payer: Self-pay | Admitting: Family

## 2020-12-26 DIAGNOSIS — J309 Allergic rhinitis, unspecified: Secondary | ICD-10-CM | POA: Diagnosis not present

## 2020-12-26 NOTE — Telephone Encounter (Signed)
Pt is not sure why we have the refill request as pt has not been taking it he said just avoid it

## 2020-12-26 NOTE — Telephone Encounter (Signed)
I stopped this medication at his October 2021 office visit. Please call the patient and see if he is taking this medication

## 2020-12-26 NOTE — Telephone Encounter (Signed)
noted 

## 2020-12-28 ENCOUNTER — Other Ambulatory Visit: Payer: Self-pay

## 2020-12-28 ENCOUNTER — Encounter: Payer: Self-pay | Admitting: Allergy

## 2020-12-28 ENCOUNTER — Ambulatory Visit: Payer: Commercial Managed Care - PPO | Admitting: Allergy

## 2020-12-28 VITALS — BP 144/76 | HR 78 | Temp 98.2°F | Resp 16 | Ht 75.59 in | Wt 271.8 lb

## 2020-12-28 DIAGNOSIS — R053 Chronic cough: Secondary | ICD-10-CM

## 2020-12-28 DIAGNOSIS — R062 Wheezing: Secondary | ICD-10-CM | POA: Diagnosis not present

## 2020-12-28 DIAGNOSIS — J3089 Other allergic rhinitis: Secondary | ICD-10-CM

## 2020-12-28 MED ORDER — ALBUTEROL SULFATE HFA 108 (90 BASE) MCG/ACT IN AERS: 2.0000 | INHALATION_SPRAY | RESPIRATORY_TRACT | 1 refills | Status: DC | PRN

## 2020-12-28 MED ORDER — IPRATROPIUM BROMIDE 0.06 % NA SOLN: 2.0000 | Freq: Two times a day (BID) | NASAL | 5 refills | Status: DC

## 2020-12-28 MED ORDER — CARBINOXAMINE MALEATE 4 MG/5ML PO SOLN: 10.0000 mL | Freq: Two times a day (BID) | ORAL | 5 refills | Status: DC

## 2020-12-28 MED ORDER — ALVESCO 160 MCG/ACT IN AERS: 1.0000 | INHALATION_SPRAY | Freq: Two times a day (BID) | RESPIRATORY_TRACT | 5 refills | Status: DC

## 2020-12-28 NOTE — Progress Notes (Signed)
Follow-up Note  RE: Seth Lowery MRN: [id number] DOB: 09-02-1971 Date of Office Visit: 12/28/2020   History of present illness: Seth Lowery is a 50 y.o. male presenting today for follow-up of allergic rhinitis, cough and wheeze.  He was last seen in the office on 08/23/20 by our NP Seth Lowery.  Since his last visit he did have sinus surgery performed by Dr. [name] about a month ago.  He states his sinus symptoms are better however he still reports about 3 to 4 hours after waking up he has more nasal congestion and phlegm and he also reports having more cough and wheeze symptoms at this time as well.  For his sinuses he is using a nasal saline rinse with mupirocin and mometasone twice a day.  He states the mupirocin mometasone is packaged in a pack but he is sprinkles in with his salt solution.  This is recommended by his ENT.  He also is using azelastine nasal spray but still reports like there is a growing lump in the back of his throat that he cannot seem to clear well.  He also feels like Xyzal is not very helpful.  He states he try RyVent in the past and this too was not very helpful.  He does state with his sinus when to the Dr. [name] did remove polyps.  He had postop follow-up last week and reports that the Dr. [name] states that he is healing quite well.  He states his sinus and these respiratory issues have been quite impactful in his quality of life.  He states he used to be quite active and doing endurance-based activities that he is no longer been able to enjoy or perform.  With the cough and wheeze symptoms that he experiences most days he is using his rescue inhaler at that time.  He states he was on Singulair maybe several years ago and does not believe it was very helpful.  He has not tried any maintenance phase inhalers at this point in time.  He states he did have similar symptoms when he went on vacation to the [facility] and states that the symptoms still started 3 or 4 hours or so after he woke  up.  Thus it is not actual time-of-today dependent.  He does continue on allergen immunotherapy and tolerates the injections well without any large local or systemic reactions.  Review of systems: Review of Systems  Constitutional: Negative.   HENT:       See HPI  Eyes: Negative.   Respiratory:       See HPI  Cardiovascular: Negative.   Gastrointestinal: Negative.   Musculoskeletal: Negative.   Skin: Negative.   Neurological: Negative.     All other systems negative unless noted above in HPI  Past medical/social/surgical/family history have been reviewed and are unchanged unless specifically indicated below.  No changes  Medication List: Current Outpatient Medications  Medication Sig Dispense Refill  . Carbinoxamine Maleate 4 MG/5ML SOLN Take 10 mLs (8 mg total) by mouth in the morning and at bedtime. 600 mL 5  . ciclesonide (ALVESCO) 160 MCG/ACT inhaler Inhale 1 puff into the lungs 2 (two) times daily. Rinse, gargle and spit out after use 1 each 5  . Guaifenesin 1200 MG TB12 Take 1,200 mg by mouth daily.    Marland Kitchen ipratropium (ATROVENT) 0.06 % nasal spray Place 2 sprays into both nostrils 2 (two) times daily. 15 mL 5  . mupirocin nasal ointment (BACTROBAN) 2 % Place 1 application  into the nose 2 (two) times daily. Use one-half of tube in each nostril twice daily for five (5) days. After application, press sides of nose together and gently massage.    . Testosterone Cypionate 100 MG/ML SOLN Inject 1 ml by I.M. every 2 weeks. 10 mL 0  . UNABLE TO FIND Med Name: immunotherapy    . albuterol (PROAIR HFA) 108 (90 Base) MCG/ACT inhaler Inhale 2 puffs into the lungs every 4 (four) hours as needed for wheezing or shortness of breath. 18 g 1  . Azelastine & Fluticasone 137 & 50 MCG/ACT THPK Place 1 spray into the nose 2 (two) times daily as needed. (Patient not taking: Reported on 12/28/2020) 1 each 5  . EPINEPHrine (AUVI-Q) 0.3 mg/0.3 mL IJ SOAJ injection Inject 0.3 mg into the muscle as needed  for anaphylaxis. (Patient not taking: Reported on 12/28/2020) 1 each 1   No current facility-administered medications for this visit.     Known medication allergies: No Known Allergies   Physical examination: Blood pressure (!) 144/76, pulse 78, temperature 98.2 F (36.8 C), temperature source Temporal, resp. rate 16, height 6' 3.59" (1.92 m), weight 271 lb 12.8 oz (123.3 kg), SpO2 96 %.  General: Alert, interactive, in no acute distress. HEENT: PERRLA, TMs pearly gray, turbinates moderately edematous L>R without discharge, post-pharynx non erythematous. Neck: Supple without lymphadenopathy. Lungs: Clear to auscultation without wheezing, rhonchi or rales. {no increased work of breathing. CV: Normal S1, S2 without murmurs. Abdomen: Nondistended, nontender. Skin: Warm and dry, without lesions or rashes. Extremities:  No clubbing, cyanosis or edema. Neuro:   Grossly intact.  Diagnositics/Labs:  Spirometry: FEV1: 3.59 L 76%, FVC: 4.81 L 79% predicted.  This is slightly better than his previous study from 2021.  This is consistent with mild restriction  Assessment and plan:   Seasonal and perennial allergic rhinitis Continue allergy injections per protocol Xyzal has not been effective Try Carbinaxomine 4mg /66ml take 52ml twice a day.  This is an antihistamine you not have tried thus far Stop Azelastine nasal spray Start Atrovent nasal spray 2 sprays each nostril twice a day (can be used up to 3-4 times a day as needed for nasal drainage control).  If nasal passage is getting too dry then decrease and/or stop use Continue your nasal rinse with mometasone as directed by Dr. [name]  Cough persistent and Wheeze Have access to albuterol inhaler 2 puffs every 4-6 hours as needed for cough/wheeze/shortness of breath/chest tightness.  May use 15-20 minutes prior to activity.   Monitor frequency of use.   Start Alvesco inhaler 2 puffs twice a day.  This is a maintenance respiratory medication  that should help decrease use of albuterol daily use at this time.  Samples provided today  Follow-up in 3-4 months or sooner if needed  I appreciate the opportunity to take part in Breyer's care. Please do not hesitate to contact me with questions.  Sincerely,   Prudy Feeler, MD Allergy/Immunology [facility]

## 2020-12-28 NOTE — Patient Instructions (Addendum)
Seasonal and perennial allergic rhinitis Continue allergy injections per protocol Xyzal has not been effective Try Carbinaxomine 4mg /67ml take 84ml twice a day.  This is an antihistamine you not have tried thus far Stop Azelastine nasal spray Start Atrovent nasal spray 2 sprays each nostril twice a day (can be used up to 3-4 times a day as needed for nasal drainage control).  If nasal passage is getting too dry then decrease and/or stop use Continue your nasal rinse with mometasone as directed by Dr. [name]  Cough persistent and Wheeze Have access to albuterol inhaler 2 puffs every 4-6 hours as needed for cough/wheeze/shortness of breath/chest tightness.  May use 15-20 minutes prior to activity.   Monitor frequency of use.   Start Alvesco inhaler 2 puffs twice a day.  This is a maintenance respiratory medication that should help decrease use of albuterol daily use at this time.  Samples provided today  Follow-up in 3-4 months or sooner if needed

## 2021-01-06 ENCOUNTER — Ambulatory Visit (INDEPENDENT_AMBULATORY_CARE_PROVIDER_SITE_OTHER): Payer: Commercial Managed Care - PPO | Admitting: *Deleted

## 2021-01-06 DIAGNOSIS — J309 Allergic rhinitis, unspecified: Secondary | ICD-10-CM

## 2021-01-11 ENCOUNTER — Ambulatory Visit (INDEPENDENT_AMBULATORY_CARE_PROVIDER_SITE_OTHER): Payer: Commercial Managed Care - PPO | Admitting: *Deleted

## 2021-01-11 DIAGNOSIS — J309 Allergic rhinitis, unspecified: Secondary | ICD-10-CM | POA: Diagnosis not present

## 2021-01-16 ENCOUNTER — Ambulatory Visit (INDEPENDENT_AMBULATORY_CARE_PROVIDER_SITE_OTHER): Payer: Commercial Managed Care - PPO

## 2021-01-16 DIAGNOSIS — J309 Allergic rhinitis, unspecified: Secondary | ICD-10-CM

## 2021-01-24 ENCOUNTER — Ambulatory Visit (INDEPENDENT_AMBULATORY_CARE_PROVIDER_SITE_OTHER): Payer: Commercial Managed Care - PPO

## 2021-01-24 DIAGNOSIS — J309 Allergic rhinitis, unspecified: Secondary | ICD-10-CM | POA: Diagnosis not present

## 2021-01-30 ENCOUNTER — Ambulatory Visit (INDEPENDENT_AMBULATORY_CARE_PROVIDER_SITE_OTHER): Payer: Commercial Managed Care - PPO

## 2021-01-30 DIAGNOSIS — J309 Allergic rhinitis, unspecified: Secondary | ICD-10-CM

## 2021-02-06 ENCOUNTER — Ambulatory Visit (INDEPENDENT_AMBULATORY_CARE_PROVIDER_SITE_OTHER): Payer: Commercial Managed Care - PPO

## 2021-02-06 DIAGNOSIS — J309 Allergic rhinitis, unspecified: Secondary | ICD-10-CM | POA: Diagnosis not present

## 2021-02-09 ENCOUNTER — Other Ambulatory Visit: Payer: Self-pay

## 2021-02-09 ENCOUNTER — Encounter: Payer: Self-pay | Admitting: Neurology

## 2021-02-09 ENCOUNTER — Ambulatory Visit: Payer: Commercial Managed Care - PPO | Admitting: Neurology

## 2021-02-09 VITALS — BP 142/87 | HR 75 | Ht 76.0 in | Wt 275.0 lb

## 2021-02-09 DIAGNOSIS — R0683 Snoring: Secondary | ICD-10-CM | POA: Diagnosis not present

## 2021-02-09 DIAGNOSIS — E669 Obesity, unspecified: Secondary | ICD-10-CM

## 2021-02-09 DIAGNOSIS — E66811 Obesity, class 1: Secondary | ICD-10-CM

## 2021-02-09 DIAGNOSIS — Z820 Family history of epilepsy and other diseases of the nervous system: Secondary | ICD-10-CM

## 2021-02-09 DIAGNOSIS — R5383 Other fatigue: Secondary | ICD-10-CM | POA: Diagnosis not present

## 2021-02-09 DIAGNOSIS — R7989 Other specified abnormal findings of blood chemistry: Secondary | ICD-10-CM | POA: Diagnosis not present

## 2021-02-09 DIAGNOSIS — Z9189 Other specified personal risk factors, not elsewhere classified: Secondary | ICD-10-CM

## 2021-02-09 DIAGNOSIS — R635 Abnormal weight gain: Secondary | ICD-10-CM

## 2021-02-09 NOTE — Progress Notes (Signed)
Subjective:    Patient ID: Seth Lowery is a 49 y.o. male.  HPI     Star Age, MD, PhD [facility] [address] [address] [place], [place] [address]  Dear Dr. Ethelene Hal,   I saw your patient, Seth Lowery, upon your kind request in my sleep clinic today for initial consultation of his sleep disorder, in particular, concern for underlying obstructive sleep apnea.  The patient is unaccompanied today.  As you know, Seth Lowery is a 50 year old right-handed gentleman with an underlying medical history of recurrent sinusitis, nasal polyps with status post ethmoidectomy and sphenoidectomy, frontal sinusotomy with polyp removal and maxillary antrostomy with polyp removal in February 2022 under Dr. [name], allergic rhinitis, low testosterone, and obesity, who reports snoring and tiredness for the past nearly 4 years.  Symptoms started in or around 2017.  He has had lack of energy and used to exercise on a regular basis and keep a fairly stable weight but has had weight increase and lack of stamina.  He has been followed by an allergy specialist on a regular basis and gets weekly allergy injections, has been checked for asthma in the past as he has a history of wheezing intermittently, no formal diagnosis of asthma in the past.  I reviewed your office note from 12/16/2020.  He was noted to have wheezing and was supposed to see a pulmonologist.  He has never had a sleep study, Epworth sleepiness score is 6 out of 24, fatigue severity score is 44 out of 63.  He is on testosterone injections.  He is married and lives with his wife and his teenage son who is getting ready to go to college, his older daughter is in college already.  He is a non-smoker and drinks alcohol rarely, maybe 2-3 times a year, drinks caffeine approximately 54 mg daily in the form of soda.  He keeps a steady sleep schedule, typically in bed between 9 and 10 PM and rise time is between 530 and 6 AM.  He does not  have night to night nocturia or recurrent morning headaches.  He has had weight gain.  He does not have any telltale symptoms of restless leg syndrome.  Both parents do have sleep apnea and have CPAP machines.  He has noticed slight swelling in his ankles and lower extremities with prolonged standing or prolonged sitting.  His Past Medical History Is Significant For: Past Medical History:  Diagnosis Date  . Recurrent sinusitis     His Past Surgical History Is Significant For: Past Surgical History:  Procedure Laterality Date  . ADENOIDECTOMY    . ETHMOIDECTOMY Bilateral 11/07/2020   Procedure: ENDOSCOPIC ETHMOIDECTOMY;  Surgeon: Leta Baptist, MD;  Location: [place];  Service: ENT;  Laterality: Bilateral;  . FRONTAL SINUS EXPLORATION Bilateral 11/07/2020   Procedure: ENDOSCOPIC FRONTAL RECESS SINUS EXPLORATION;  Surgeon: Leta Baptist, MD;  Location: [place];  Service: ENT;  Laterality: Bilateral;  . MAXILLARY ANTROSTOMY Bilateral 11/07/2020   Procedure: ENDOSCOPIC MAXILLARY ANTROSTOMY WITH TISSUE REMOVAL;  Surgeon: Leta Baptist, MD;  Location: [place];  Service: ENT;  Laterality: Bilateral;  . NASAL SINUS SURGERY  08/05/2018  . SINUS ENDO WITH FUSION Bilateral 11/07/2020   Procedure: SINUS ENDOSCOPY WITH FUSION NAVIGATION;  Surgeon: Leta Baptist, MD;  Location: [place];  Service: ENT;  Laterality: Bilateral;  . SINUS SURGERY WITH INSTATRAK    . SINUS SURGERY WITH INSTATRAK    . SPHENOIDECTOMY  Bilateral 11/07/2020   Procedure: ENDOSCOPIC SPHENOIDECTOMY WITH TISSUE REMOVAL;  Surgeon: Leta Baptist, MD;  Location: [place];  Service: ENT;  Laterality: Bilateral;  . TONSILLECTOMY      His Family History Is Significant For: Family History  Problem Relation Age of Onset  . Healthy Mother   . Diabetes Father   . Allergic rhinitis Father   . Angioedema Neg Hx   . Asthma Neg Hx   . Atopy Neg Hx   . Eczema Neg Hx   .  Immunodeficiency Neg Hx   . Urticaria Neg Hx     His Social History Is Significant For: Social History   Socioeconomic History  . Marital status: Married    Spouse name: Not on file  . Number of children: Not on file  . Years of education: Not on file  . Highest education level: Not on file  Occupational History  . Not on file  Tobacco Use  . Smoking status: Never Smoker  . Smokeless tobacco: Never Used  Vaping Use  . Vaping Use: Never used  Substance and Sexual Activity  . Alcohol use: Not Currently  . Drug use: Never  . Sexual activity: Yes    Birth control/protection: Condom  Other Topics Concern  . Not on file  Social History Narrative  . Not on file   Social Determinants of Health   Financial Resource Strain: Not on file  Food Insecurity: Not on file  Transportation Needs: Not on file  Physical Activity: Not on file  Stress: Not on file  Social Connections: Not on file    His Allergies Are:  No Known Allergies:   His Current Medications Are:  Outpatient Encounter Medications as of 02/09/2021  Medication Sig  . Carbinoxamine Maleate 4 MG/5ML SOLN Take 10 mLs (8 mg total) by mouth in the morning and at bedtime.  Marland Kitchen ipratropium (ATROVENT) 0.06 % nasal spray Place 2 sprays into both nostrils 2 (two) times daily.  Marland Kitchen OVER THE COUNTER MEDICATION 2 (two) times daily. [facility] nasal rinse ENT- mupirocin 20 mg/mometasone 0.6 mg/ AMPHC B 5 mg  . OVER THE COUNTER MEDICATION Inhale into the lungs in the morning and at bedtime. Adesonide inhaler  . [DISCONTINUED] ciclesonide (ALVESCO) 160 MCG/ACT inhaler Inhale 1 puff into the lungs 2 (two) times daily. Rinse, gargle and spit out after use  . [DISCONTINUED] albuterol (PROAIR HFA) 108 (90 Base) MCG/ACT inhaler Inhale 2 puffs into the lungs every 4 (four) hours as needed for wheezing or shortness of breath.  . [DISCONTINUED] Azelastine & Fluticasone 137 & 50 MCG/ACT THPK Place 1 spray into the nose 2 (two) times  daily as needed. (Patient not taking: Reported on 12/28/2020)  . [DISCONTINUED] EPINEPHrine (AUVI-Q) 0.3 mg/0.3 mL IJ SOAJ injection Inject 0.3 mg into the muscle as needed for anaphylaxis. (Patient not taking: Reported on 12/28/2020)  . [DISCONTINUED] Guaifenesin 1200 MG TB12 Take 1,200 mg by mouth daily.  . [DISCONTINUED] mupirocin nasal ointment (BACTROBAN) 2 % Place 1 application into the nose 2 (two) times daily. Use one-half of tube in each nostril twice daily for five (5) days. After application, press sides of nose together and gently massage.  . [DISCONTINUED] Testosterone Cypionate 100 MG/ML SOLN Inject 1 ml by I.M. every 2 weeks.  . [DISCONTINUED] UNABLE TO FIND Med Name: immunotherapy   No facility-administered encounter medications on file as of 02/09/2021.  :   Review of Systems:  Out of a complete 14 point review of systems,  all are reviewed and negative with the exception of these symptoms as listed below:  Review of Systems  Neurological:       Pt presents today to evaluate sleep.  He has never had a sleep study.  He has a long history of sinus concerns which is followed by a ENT.  In talking with his primary care they were discussing his quality of sleep.  Patient indicates an average of 7 hours of sleep each night.  He states this is solid sleep although he may reposition 3-4 times at night.  Typically does not wake up at night.  More recently he is began snoring but contributed that to his sinus concerns.  He does complain of waking up feeling tired after 7 hours of sleep. Epworth Sleepiness Scale 0= would never doze 1= slight chance of dozing 2= moderate chance of dozing 3= high chance of dozing  Sitting and reading:0 Watching TV:1 Sitting inactive in a public place (ex. Theater or meeting):0 As a passenger in a car for an hour without a break:1 Lying down to rest in the afternoon:3 Sitting and talking to someone:0 Sitting quietly after lunch (no alcohol):0 In a car,  while stopped in traffic:0 Total:6     Objective:  Neurological Exam  Physical Exam Physical Examination:   Vitals:   02/09/21 1215  BP: (!) 142/87  Pulse: 75    General Examination: The patient is a very pleasant 50 y.o. male in no acute distress. He appears well-developed and well-nourished and well groomed.   HEENT: Normocephalic, atraumatic, pupils are equal, round and reactive to light, extraocular tracking is good without limitation to gaze excursion or nystagmus noted. Hearing is grossly intact. Face is symmetric with normal facial animation. Speech is clear with no dysarthria noted. There is no hypophonia. There is no lip, neck/head, jaw or voice tremor. Neck is supple with full range of passive and active motion. There are no carotid bruits on auscultation. Oropharynx exam reveals: mild mouth dryness, adequate dental hygiene and moderate airway crowding, due to small airway entry and slightly longer appearing uvula.  Tonsils are absent.  Mallampati is class I, neck circumference 18-7/8 inches. Tongue protrudes centrally and palate elevates symmetrically.   Chest: Clear to auscultation without wheezing, rhonchi or crackles noted.  Heart: S1+S2+0, regular and normal without murmurs, rubs or gallops noted.   Abdomen: Soft, non-tender and non-distended.  Extremities: There is trace pitting edema in the distal lower extremities bilaterally.   Skin: Warm and dry without trophic changes noted.   Musculoskeletal: exam reveals no obvious joint deformities, tenderness or joint swelling or erythema.   Neurologically:  Mental status: The patient is awake, alert and oriented in all 4 spheres. His immediate and remote memory, attention, language skills and fund of knowledge are appropriate. There is no evidence of aphasia, agnosia, apraxia or anomia. Speech is clear with normal prosody and enunciation. Thought process is linear. Mood is normal and affect is normal.  Cranial nerves II -  XII are as described above under HEENT exam.  Motor exam: Normal bulk, strength and tone is noted. There is no tremor, Romberg is negative. Fine motor skills and coordination: grossly intact.  Cerebellar testing: No dysmetria or intention tremor. There is no truncal or gait ataxia.  Sensory exam: intact to light touch in the upper and lower extremities.  Gait, station and balance: He stands easily. No veering to one side is noted. No leaning to one side is noted. Posture is age-appropriate and  stance is narrow based. Gait shows normal stride length and normal pace. No problems turning are noted. Tandem walk is unremarkable.                Assessment and Plan:  In summary, Romolo Sieling is a very pleasant 50 y.o.-year old male with an underlying medical history of recurrent sinusitis, nasal polyps with status post ethmoidectomy and sphenoidectomy, frontal sinusotomy with polyp removal and maxillary antrostomy with polyp removal in February 2022 under Dr. [name], allergic rhinitis, low testosterone, and obesity, whose history and physical exam are concerning for obstructive sleep apnea (OSA). I had a long chat with the patient about my findings and the diagnosis of OSA, its prognosis and treatment options. We talked about medical treatments, surgical interventions and non-pharmacological approaches. I explained in particular the risks and ramifications of untreated moderate to severe OSA, especially with respect to developing cardiovascular disease down the Road, including congestive heart failure, difficult to treat hypertension, cardiac arrhythmias, or stroke. Even type 2 diabetes has, in part, been linked to untreated OSA. Symptoms of untreated OSA include daytime sleepiness, memory problems, mood irritability and mood disorder such as depression and anxiety, lack of energy, as well as recurrent headaches, especially morning headaches. We talked about trying to maintain a healthy lifestyle in general, as well  as the importance of weight control. We also talked about the importance of good sleep hygiene. I recommended the following at this time: sleep study.  I explained the sleep test procedure to the patient and also outlined possible surgical and non-surgical treatment options of OSA, including the use of a custom-made dental device (which would require a referral to a specialist dentist or oral surgeon), upper airway surgical options, such as traditional UPPP or a novel less invasive surgical option in the form of Inspire hypoglossal nerve stimulation (which would involve a referral to an ENT surgeon). I also explained the CPAP treatment option to the patient, who indicated that he would be willing to try CPAP if the need arises. I explained the importance of being compliant with PAP treatment, not only for insurance purposes but primarily to improve His symptoms, and for the patient's long term health benefit, including to reduce His cardiovascular risks. I answered all his questions today and the patient was in agreement. I plan to see him back after the sleep study is completed and encouraged him to call with any interim questions, concerns, problems or updates.   Thank you very much for allowing me to participate in the care of this nice patient. If I can be of any further assistance to you please do not hesitate to call me at [phone].  Sincerely,   Star Age, MD, PhD

## 2021-02-09 NOTE — Patient Instructions (Signed)

## 2021-02-14 ENCOUNTER — Ambulatory Visit (INDEPENDENT_AMBULATORY_CARE_PROVIDER_SITE_OTHER): Payer: Commercial Managed Care - PPO

## 2021-02-14 DIAGNOSIS — J309 Allergic rhinitis, unspecified: Secondary | ICD-10-CM

## 2021-02-15 ENCOUNTER — Other Ambulatory Visit: Payer: Self-pay

## 2021-02-15 ENCOUNTER — Ambulatory Visit (AMBULATORY_SURGERY_CENTER): Payer: Self-pay

## 2021-02-15 VITALS — Ht 75.0 in | Wt 279.0 lb

## 2021-02-15 DIAGNOSIS — Z1211 Encounter for screening for malignant neoplasm of colon: Secondary | ICD-10-CM

## 2021-02-15 MED ORDER — NA SULFATE-K SULFATE-MG SULF 17.5-3.13-1.6 GM/177ML PO SOLN: 1.0000 | ORAL | 0 refills | Status: DC

## 2021-02-15 NOTE — Progress Notes (Signed)

## 2021-02-21 ENCOUNTER — Ambulatory Visit (INDEPENDENT_AMBULATORY_CARE_PROVIDER_SITE_OTHER): Payer: Commercial Managed Care - PPO

## 2021-02-21 DIAGNOSIS — J309 Allergic rhinitis, unspecified: Secondary | ICD-10-CM | POA: Diagnosis not present

## 2021-02-22 ENCOUNTER — Other Ambulatory Visit: Payer: Self-pay | Admitting: Allergy

## 2021-02-22 ENCOUNTER — Telehealth: Payer: Self-pay

## 2021-02-22 NOTE — Telephone Encounter (Signed)
Left patient a message to call office regarding albuterol hfa refill which is too soon to fill. I also wanted to make patient wasn't having any breathing, cough or wheezing issues that he would need another albuterol hfa.  Spoke with someone in the pharmacy and she stated that refills are sometimes sent too soon through the computer system and patient not even aware of it.

## 2021-02-22 NOTE — Telephone Encounter (Signed)
Okay pt didn't need this refill.

## 2021-02-22 NOTE — Telephone Encounter (Signed)
Pt called back and stated albuterol was not needed, he was not sure how that  Was even requested.

## 2021-02-27 ENCOUNTER — Ambulatory Visit (INDEPENDENT_AMBULATORY_CARE_PROVIDER_SITE_OTHER): Payer: Commercial Managed Care - PPO

## 2021-02-27 DIAGNOSIS — J309 Allergic rhinitis, unspecified: Secondary | ICD-10-CM | POA: Diagnosis not present

## 2021-03-01 ENCOUNTER — Other Ambulatory Visit: Payer: Self-pay

## 2021-03-01 ENCOUNTER — Encounter: Payer: Self-pay | Admitting: Gastroenterology

## 2021-03-01 ENCOUNTER — Ambulatory Visit (AMBULATORY_SURGERY_CENTER): Payer: Commercial Managed Care - PPO | Admitting: Gastroenterology

## 2021-03-01 VITALS — BP 110/77 | HR 77 | Temp 98.7°F | Resp 14 | Ht 75.0 in | Wt 279.0 lb

## 2021-03-01 DIAGNOSIS — D123 Benign neoplasm of transverse colon: Secondary | ICD-10-CM

## 2021-03-01 DIAGNOSIS — D126 Benign neoplasm of colon, unspecified: Secondary | ICD-10-CM | POA: Diagnosis not present

## 2021-03-01 DIAGNOSIS — D125 Benign neoplasm of sigmoid colon: Secondary | ICD-10-CM

## 2021-03-01 DIAGNOSIS — D122 Benign neoplasm of ascending colon: Secondary | ICD-10-CM

## 2021-03-01 DIAGNOSIS — Z1211 Encounter for screening for malignant neoplasm of colon: Secondary | ICD-10-CM

## 2021-03-01 MED ORDER — SODIUM CHLORIDE 0.9 % IV SOLN: 500.0000 mL | Freq: Once | INTRAVENOUS | Status: DC

## 2021-03-01 NOTE — Op Note (Signed)
[place] Patient Name: Seth Lowery Procedure Date: 03/01/2021 10:56 AM MRN: [id number] Endoscopist: Jackquline Denmark , MD Age: 50 Referring MD:  Date of Birth: Apr 16, 1971 Gender: Male Account #: [id number] Procedure:                Colonoscopy Indications:              Screening for colorectal malignant neoplasm. FH of                            colon polyps dad>74yr age Medicines:                Monitored Anesthesia Care Procedure:                Pre-Anesthesia Assessment:                           - Prior to the procedure, a History and Physical                            was performed, and patient medications and                            allergies were reviewed. The patient's tolerance of                            previous anesthesia was also reviewed. The risks                            and benefits of the procedure and the sedation                            options and risks were discussed with the patient.                            All questions were answered, and informed consent                            was obtained. Prior Anticoagulants: The patient has                            taken no previous anticoagulant or antiplatelet                            agents. ASA Grade Assessment: II - A patient with                            mild systemic disease. After reviewing the risks                            and benefits, the patient was deemed in                            satisfactory condition to undergo the procedure.  After obtaining informed consent, the colonoscope                            was passed under direct vision. Throughout the                            procedure, the patient's blood pressure, pulse, and                            oxygen saturations were monitored continuously. The                            Colonoscope was introduced through the anus and                            advanced to the the cecum, identified  by                            appendiceal orifice and ileocecal valve. The                            colonoscopy was performed without difficulty. The                            patient tolerated the procedure well. The quality                            of the bowel preparation was good. The ileocecal                            valve, appendiceal orifice, and rectum were                            photographed. Scope In: 11:10:17 AM Scope Out: 11:27:02 AM Scope Withdrawal Time: 0 hours 12 minutes 53 seconds  Total Procedure Duration: 0 hours 16 minutes 45 seconds  Findings:                 Five sessile polyps were found in the mid sigmoid                            colon, distal sigmoid colon, transverse colon,                            hepatic flexure and proximal ascending colon. The                            polyps were 4 to 8 mm in size. These polyps were                            removed with a cold snare. Resection and retrieval                            were complete.  A few rare medium-mouthed diverticula were found in                            the sigmoid colon.                           Non-bleeding internal hemorrhoids were found during                            retroflexion. The hemorrhoids were small.                           The exam was otherwise without abnormality on                            direct and retroflexion views. Complications:            No immediate complications. Estimated Blood Loss:     Estimated blood loss: none. Impression:               - Five 4 to 8 mm polyps in the mid sigmoid colon,                            in the distal sigmoid colon, in the transverse                            colon, at the hepatic flexure and in the proximal                            ascending colon, removed with a cold snare.                            Resected and retrieved.                           - Mild sigmoid diverticulosis.                            - Non-bleeding internal hemorrhoids.                           - The examination was otherwise normal on direct                            and retroflexion views. Recommendation:           - Patient has a contact number available for                            emergencies. The signs and symptoms of potential                            delayed complications were discussed with the                            patient. Return to normal activities tomorrow.  Written discharge instructions were provided to the                            patient.                           - High fiber diet.                           - Continue present medications.                           - Await pathology results.                           - Repeat colonoscopy for surveillance based on                            pathology results.                           - The findings and recommendations were discussed                            with the patient's family. Jackquline Denmark, MD 03/01/2021 11:32:22 AM This report has been signed electronically.

## 2021-03-01 NOTE — Progress Notes (Signed)
PT taken to PACU. Monitors in place. VSS. Report given to RN. 

## 2021-03-01 NOTE — Progress Notes (Signed)
Called to room to assist during endoscopic procedure.  Patient ID and intended procedure confirmed with present staff. Received instructions for my participation in the procedure from the performing physician.  

## 2021-03-01 NOTE — Progress Notes (Signed)
Pt's states no medical or surgical changes since previsit or office visit. 

## 2021-03-01 NOTE — Patient Instructions (Signed)
Read all of the handouts given to you by your recovery room nurse.  Keep your appointment for sleep apnea evaluation.  YOU HAD AN ENDOSCOPIC PROCEDURE TODAY AT [facility]:   Refer to the procedure report that was given to you for any specific questions about what was found during the examination.  If the procedure report does not answer your questions, please call your gastroenterologist to clarify.  If you requested that your care partner not be given the details of your procedure findings, then the procedure report has been included in a sealed envelope for you to review at your convenience later.  YOU SHOULD EXPECT: Some feelings of bloating in the abdomen. Passage of more gas than usual.  Walking can help get rid of the air that was put into your GI tract during the procedure and reduce the bloating. If you had a lower endoscopy (such as a colonoscopy or flexible sigmoidoscopy) you may notice spotting of blood in your stool or on the toilet paper. If you underwent a bowel prep for your procedure, you may not have a normal bowel movement for a few days.  Please Note:  You might notice some irritation and congestion in your nose or some drainage.  This is from the oxygen used during your procedure.  There is no need for concern and it should clear up in a day or so.  SYMPTOMS TO REPORT IMMEDIATELY:  Following lower endoscopy (colonoscopy or flexible sigmoidoscopy):  Excessive amounts of blood in the stool  Significant tenderness or worsening of abdominal pains  Swelling of the abdomen that is new, acute  Fever of 100F or higher   For urgent or emergent issues, a gastroenterologist can be reached at any hour by calling [phone]. Do not use MyChart messaging for urgent concerns.    DIET:  We do recommend a small meal at first, but then you may proceed to your regular diet.  Drink plenty of fluids but you should avoid alcoholic beverages for 24 hours. Try to increase the  fiber in your diet, and drink plenty of water.  ACTIVITY:  You should plan to take it easy for the rest of today and you should NOT DRIVE or use heavy machinery until tomorrow (because of the sedation medicines used during the test).    FOLLOW UP: Our staff will call the number listed on your records 48-72 hours following your procedure to check on you and address any questions or concerns that you may have regarding the information given to you following your procedure. If we do not reach you, we will leave a message.  We will attempt to reach you two times.  During this call, we will ask if you have developed any symptoms of COVID 19. If you develop any symptoms (ie: fever, flu-like symptoms, shortness of breath, cough etc.) before then, please call [phone].  If you test positive for Covid 19 in the 2 weeks post procedure, please call and report this information to [place].    If any biopsies were taken you will be contacted by phone or by letter within the next 1-3 weeks.  Please call us at [phone] if you have not heard about the biopsies in 3 weeks.    SIGNATURES/CONFIDENTIALITY: You and/or your care partner have signed paperwork which will be entered into your electronic medical record.  These signatures attest to the fact that that the information above on your After Visit Summary has been reviewed and is understood.  Full responsibility of the confidentiality of this discharge information lies with you and/or your care-partner.  

## 2021-03-01 NOTE — Progress Notes (Signed)
C.W. vital signs. 

## 2021-03-03 ENCOUNTER — Telehealth: Payer: Self-pay

## 2021-03-03 ENCOUNTER — Telehealth: Payer: Self-pay | Admitting: *Deleted

## 2021-03-03 ENCOUNTER — Telehealth: Payer: Self-pay | Admitting: Neurology

## 2021-03-03 NOTE — Telephone Encounter (Signed)
Unable to leave message

## 2021-03-03 NOTE — Telephone Encounter (Signed)
Pt is asking for a call as soon as the information is available re: if he will be approved for a sleep study or not.

## 2021-03-06 ENCOUNTER — Ambulatory Visit (INDEPENDENT_AMBULATORY_CARE_PROVIDER_SITE_OTHER): Payer: Commercial Managed Care - PPO

## 2021-03-06 DIAGNOSIS — J309 Allergic rhinitis, unspecified: Secondary | ICD-10-CM

## 2021-03-09 ENCOUNTER — Encounter: Payer: Self-pay | Admitting: Gastroenterology

## 2021-03-10 ENCOUNTER — Ambulatory Visit: Payer: Self-pay

## 2021-03-13 NOTE — Telephone Encounter (Signed)
Spoke with pt's wife and got the pt scheduled for home sleep study.

## 2021-03-13 NOTE — Telephone Encounter (Signed)
Patient's wife called in today for an update on scheduling patient's sleep study. Advised her we are waiting on insurance approval and would reach out as soon as that is received.

## 2021-03-14 ENCOUNTER — Ambulatory Visit (INDEPENDENT_AMBULATORY_CARE_PROVIDER_SITE_OTHER): Payer: Commercial Managed Care - PPO

## 2021-03-14 DIAGNOSIS — J309 Allergic rhinitis, unspecified: Secondary | ICD-10-CM | POA: Diagnosis not present

## 2021-03-27 ENCOUNTER — Ambulatory Visit (INDEPENDENT_AMBULATORY_CARE_PROVIDER_SITE_OTHER): Payer: Commercial Managed Care - PPO | Admitting: Neurology

## 2021-03-27 ENCOUNTER — Ambulatory Visit (INDEPENDENT_AMBULATORY_CARE_PROVIDER_SITE_OTHER): Payer: Commercial Managed Care - PPO

## 2021-03-27 DIAGNOSIS — E669 Obesity, unspecified: Secondary | ICD-10-CM

## 2021-03-27 DIAGNOSIS — G4733 Obstructive sleep apnea (adult) (pediatric): Secondary | ICD-10-CM | POA: Diagnosis not present

## 2021-03-27 DIAGNOSIS — E66811 Obesity, class 1: Secondary | ICD-10-CM

## 2021-03-27 DIAGNOSIS — Z820 Family history of epilepsy and other diseases of the nervous system: Secondary | ICD-10-CM

## 2021-03-27 DIAGNOSIS — R5383 Other fatigue: Secondary | ICD-10-CM

## 2021-03-27 DIAGNOSIS — R0683 Snoring: Secondary | ICD-10-CM

## 2021-03-27 DIAGNOSIS — J309 Allergic rhinitis, unspecified: Secondary | ICD-10-CM

## 2021-03-27 DIAGNOSIS — Z9189 Other specified personal risk factors, not elsewhere classified: Secondary | ICD-10-CM

## 2021-03-27 DIAGNOSIS — R7989 Other specified abnormal findings of blood chemistry: Secondary | ICD-10-CM

## 2021-03-27 DIAGNOSIS — R635 Abnormal weight gain: Secondary | ICD-10-CM

## 2021-03-28 NOTE — Progress Notes (Signed)
See procedure note.

## 2021-03-30 NOTE — Procedures (Signed)
   [facility]  HOME SLEEP TEST (Watch PAT) REPORT  STUDY DATE: 03/27/21  DOB: [birth year]  MRN: [id number]  ORDERING CLINICIAN: Star Age, MD, PhD   REFERRING CLINICIAN: [name], MD   CLINICAL INFORMATION/HISTORY: 50 year old right-handed gentleman with an underlying medical history of recurrent sinusitis, nasal polyps with status surgery in February 2022 under Dr. [name], allergic rhinitis, low testosterone, and obesity, who reports snoring and tiredness for the past nearly 4 years.    Epworth sleepiness score: 6/24.  BMI: 33.6 kg/m  FINDINGS:   Sleep Summary:   Total Recording Time (hours, min): 8 h, 57 min  Total Sleep Time (hours, min):  8 h, 6 min   Percent REM (%):    31.1   Respiratory Indices:   Calculated pAHI (per hour):  15.3/hour         REM pAHI:    35.9/hour       NREM pAHI: 5.8/hour  Oxygen Saturation Statistics:    Oxygen Saturation (%) Mean: 93%   Minimum oxygen saturation (%):                 86%   O2 Saturation Range (%): 86 - 97%    O2 Saturation (minutes) <=88%: 1.1 min  Pulse Rate Statistics:   Pulse Mean (bpm):    64/min    Pulse Range (44 - 96/min)   IMPRESSION: OSA (obstructive sleep apnea)   RECOMMENDATION:  This home sleep test demonstrates moderate obstructive sleep apnea with a total AHI of 15.3/hour and O2 nadir of 86%. Mild to moderate snoring was noted, at times in the loud range. Treatment with positive airway pressure is recommended. The patient will be advised to proceed with an autoPAP titration/trial at home for now. A full night titration study may be considered to optimize treatment settings, if needed down the road. Please note that untreated obstructive sleep apnea may carry additional perioperative morbidity. Patients with significant obstructive sleep apnea should receive perioperative PAP therapy and the surgeons and particularly the anesthesiologist should be informed of the  diagnosis and the severity of the sleep disordered breathing. The patient should be cautioned not to drive, work at heights, or operate dangerous or heavy equipment when tired or sleepy. Review and reiteration of good sleep hygiene measures should be pursued with any patient. Other causes of the patient's symptoms, including circadian rhythm disturbances, an underlying mood disorder, medication effect and/or an underlying medical problem cannot be ruled out based on this test. Clinical correlation is recommended. The patient and his referring provider will be notified of the test results. The patient will be seen in follow up in sleep clinic at [facility].  I certify that I have reviewed the raw data recording prior to the issuance of this report in accordance with the standards of the American Academy of Sleep Medicine (AASM).  INTERPRETING PHYSICIAN:   Star Age, MD, PhD  Board Certified in Neurology and Sleep Medicine  [facility] [address], [place], [place] [address] [phone]

## 2021-03-30 NOTE — Addendum Note (Signed)
Addended by: Star Age on: 03/30/2021 05:17 PM   Modules accepted: Orders

## 2021-03-31 ENCOUNTER — Other Ambulatory Visit: Payer: Self-pay | Admitting: Family

## 2021-04-04 ENCOUNTER — Telehealth: Payer: Self-pay | Admitting: *Deleted

## 2021-04-04 ENCOUNTER — Encounter: Payer: Self-pay | Admitting: *Deleted

## 2021-04-04 NOTE — Telephone Encounter (Signed)
-----   Message from Star Age, MD sent at 03/30/2021  5:17 PM EDT ----- Patient referred by Dr. Ethelene Hal, seen by me on 02/09/21, patient had a HST on 03/27/21.    Please call and notify the patient that the recent home sleep test showed obstructive sleep apnea in the moderate range. I recommend treatment in the form of autoPAP, which means, that we don't have to bring him in for a sleep study with CPAP, but will let him start using a so called autoPAP machine at home, which is a CPAP-like machine with self-adjusting pressures. We will send the order to a local DME company (of his choice, or as per insurance requirement). The DME representative will fit him with a mask, educate him on how to use the machine, how to put the mask on, etc. I have placed an order in the chart. Please send the order, talk to patient, send report to referring MD. We will need a FU in sleep clinic for 10 weeks post-PAP set up, please arrange that with me or one of our NPs. Also reinforce the need for compliance with treatment. Thanks,   Star Age, MD, PhD [facility])

## 2021-04-04 NOTE — Telephone Encounter (Signed)
I called pt. I advised pt that Dr. Rexene Alberts reviewed their sleep study results and found that pt has moderate Sleep apnea. Dr. Rexene Alberts recommends that pt start autopap. I reviewed PAP compliance expectations with the pt. Pt is agreeable to starting an auto-PAP. I advised pt that an order will be sent to a DME, [facility], and [facility] will call the pt within about one week after they file with the pt's insurance. [facility] will show the pt how to use the machine, fit for masks, and troubleshoot the auto-PAP if needed. A follow up appt was made for insurance purposes with Dr. Rexene Alberts on 06-05-21 at 1300. Pt verbalized understanding to arrive 15 minutes early and bring their auto-PAP. A letter with all of this information in it will be mailed to the pt as a reminder. I verified with the pt that the address we have on file is correct. Pt verbalized understanding of results. Pt had no questions at this time but was encouraged to call back if questions arise. I have sent the order to [facility] and have received confirmation that they have received the order.   Letter  placed in mail.

## 2021-04-05 ENCOUNTER — Encounter: Payer: Self-pay | Admitting: Allergy

## 2021-04-05 ENCOUNTER — Other Ambulatory Visit: Payer: Self-pay

## 2021-04-05 ENCOUNTER — Ambulatory Visit: Payer: Self-pay

## 2021-04-05 ENCOUNTER — Ambulatory Visit (INDEPENDENT_AMBULATORY_CARE_PROVIDER_SITE_OTHER): Payer: Commercial Managed Care - PPO | Admitting: Allergy

## 2021-04-05 VITALS — Ht 76.0 in | Wt 265.0 lb

## 2021-04-05 DIAGNOSIS — J3089 Other allergic rhinitis: Secondary | ICD-10-CM | POA: Diagnosis not present

## 2021-04-05 DIAGNOSIS — R053 Chronic cough: Secondary | ICD-10-CM

## 2021-04-05 DIAGNOSIS — J309 Allergic rhinitis, unspecified: Secondary | ICD-10-CM

## 2021-04-05 DIAGNOSIS — R062 Wheezing: Secondary | ICD-10-CM

## 2021-04-05 MED ORDER — IPRATROPIUM BROMIDE 0.06 % NA SOLN: 2.0000 | Freq: Two times a day (BID) | NASAL | 5 refills | Status: DC

## 2021-04-05 MED ORDER — CARBINOXAMINE MALEATE 4 MG/5ML PO SOLN: 10.0000 mL | Freq: Two times a day (BID) | ORAL | 5 refills | Status: DC

## 2021-04-05 MED ORDER — ALVESCO 160 MCG/ACT IN AERS: 2.0000 | INHALATION_SPRAY | Freq: Two times a day (BID) | RESPIRATORY_TRACT | 5 refills | Status: DC

## 2021-04-05 NOTE — Patient Instructions (Addendum)
Seasonal and perennial allergic rhinitis Continue allergy injections per protocol Continue Carbinaxomine 4mg /36ml take 19ml twice a day.  This is an antihistamine that is working well for you at this time. Continue Atrovent nasal spray 2 sprays each nostril twice a day (can be used up to 3-4 times a day as needed for nasal drainage control).  If nasal passage is getting too dry then decrease and/or stop use Continue your nasal rinse regimen with mometasone and mupirocin as directed by Dr. [name]  Cough persistent and Wheeze Have access to albuterol inhaler 2 puffs every 4-6 hours as needed for cough/wheeze/shortness of breath/chest tightness.  May use 15-20 minutes prior to activity.   Monitor frequency of use.   Continue Alvesco inhaler 2 puffs twice a day.  I am hopeful that we will be able to wean this down over time and may be able to discontinue once you have been under good control for significant period  Follow-up in 6 months or sooner if needed

## 2021-04-05 NOTE — Telephone Encounter (Signed)
[name], RN got it!   Thanks!       Previous Messages    ----- Message -----  From: [name], RN  Sent: 04/04/2021   1:41 PM EDT  To: Ocie Bob  Subject: new autopap user                               Salley Scarlet, new autopap user;   [name]   Male, 50 y.o., 1970/11/24   MRN:  [id number]  Phone:  [phone]   Thank you.

## 2021-04-05 NOTE — Progress Notes (Signed)
RE: Seth Lowery MRN: [id number] DOB: 08/19/1971 Date of Telemedicine Visit: 04/05/2021  Referring provider: Libby Lowery,* Primary care provider: [name], MD  Chief Complaint: Follow-up and Allergic Rhinitis    Telemedicine Follow Up Visit via Telephone: I connected with Seth Lowery for a follow up on 04/05/21 by telephone and verified that I am speaking with the correct person using two identifiers.   I discussed the limitations, risks, security and privacy concerns of performing an evaluation and management service by telephone and the availability of in person appointments. I also discussed with the patient that there may be a patient responsible charge related to this service. The patient expressed understanding and agreed to proceed.  Patient is at work. Provider is home due to [place] Visit start time: 412PM Visit end time: 4:22 PM Insurance consent/check in by: [facility] Medical consent and medical assistant/nurse: Seth Lowery  History of Present Illness: He is a 50 y.o. male, who is being followed for allergic rhinitis and cough with wheeze. His previous allergy office visit was on 12/28/2020 with Dr. [name].  He states he is doing a lot better than he was at his last visit.  He states he definitely has more good days than he does bad days.  He states he still has some symptoms about 3-4 ours after wake up with congestion and mucus with cough and occasional wheeze but it is much less mucus produced and much improved.  He states the carbinoxamine has been helpful in controlling his sinus nasal symptoms and he does take it twice a day.  He also is using the nasal Atrovent spray that he also feels has been helpful.  He denies that it is drying his nose out.  He feels like the combination of the 3 medication changes we made at last visit has been very helpful and not sure if 1 versus all 3 of them together are doing the trick.  Last medication I added in at last visit  was Alvesco inhaler due to cough and wheeze symptoms.  He is using Alvesco twice a day and with this he denies needing to use albuterol inhaler.  He is quite pleased with the improvements he has had over the past several months.  He has been back in to see Dr. Benjamine Lowery with ENT who he reports that his sinus passages are wide open but that he may have some fungal or bacterial residue still contributing to some of his symptoms.  Thus he was recommended to add mupirocin powder to his nasal mometasone rinses which he has been doing.  He feels like this too is helping. He does report if he has having more congestion he will perform just a regular saline rinse which helps as well.  He continues to tolerate his allergy injections well without large local or systemic reactions.  He is in the red vial maintenance dosing at weekly injections at this time.  Assessment and Plan: [name] is a 50 y.o. male with:   Seasonal and perennial allergic rhinitis Continue allergy injections per protocol Continue Carbinaxomine 4mg /86ml take 63ml twice a day.  This is an antihistamine that is working well for you at this time. Continue Atrovent nasal spray 2 sprays each nostril twice a day (can be used up to 3-4 times a day as needed for nasal drainage control).  If nasal passage is getting too dry then decrease and/or stop use Continue your nasal rinse regimen with mometasone and mupirocin as directed by Dr. Benjamine Lowery  Cough persistent and Wheeze Have access to albuterol inhaler 2 puffs every 4-6 hours as needed for cough/wheeze/shortness of breath/chest tightness.  May use 15-20 minutes prior to activity.   Monitor frequency of use.   Continue Alvesco inhaler 2 puffs twice a day.  I am hopeful that we will be able to wean this down over time and may be able to discontinue once you have been under good control for significant period  Follow-up in 6 months or sooner if needed   Diagnostics: None.  Medication List:  Current  Outpatient Medications  Medication Sig Dispense Refill   ALVESCO 160 MCG/ACT inhaler Inhale 1 puff into the lungs 2 (two) times daily.     Carbinoxamine Maleate 4 MG/5ML SOLN Take 10 mLs (8 mg total) by mouth in the morning and at bedtime. 600 mL 5   ipratropium (ATROVENT) 0.06 % nasal spray Place 2 sprays into both nostrils 2 (two) times daily. 15 mL 5   levocetirizine (XYZAL) 5 MG tablet TAKE 1 TABLET BY MOUTH DAILY AS NEEDED FOR ALLERGIES. 90 tablet 1   mometasone (NASONEX) 50 MCG/ACT nasal spray Place 2 sprays into the nose daily.     Mupirocin POWD Place 20 mg into the nose in the morning and at bedtime. MIX WITH MOMETASONE 0.6MG      OVER THE COUNTER MEDICATION 2 (two) times daily. [facility] nasal rinse ENT- mupirocin 20 mg/mometasone 0.6 mg/ AMPHC B 5 mg     OVER THE COUNTER MEDICATION Inhale into the lungs in the morning and at bedtime. Adesonide inhaler     testosterone cypionate (DEPOTESTOSTERONE CYPIONATE) 200 MG/ML injection Inject 200 mg into the muscle every 14 (fourteen) days.     No current facility-administered medications for this visit.   Allergies: No Known Allergies I reviewed his past medical history, social history, family history, and environmental history and no significant changes have been reported from previous visit on 12/28/2020.  Review of Systems  Constitutional: Negative.   HENT:         See HPI  Eyes: Negative.   Respiratory:         See HPI  Cardiovascular: Negative.   Musculoskeletal: Negative.   Skin: Negative.   Neurological: Negative.   Objective: Physical Exam Not obtained as encounter was done via telephone.   Previous notes and tests were reviewed.  I discussed the assessment and treatment plan with the patient. The patient was provided an opportunity to ask questions and all were answered. The patient agreed with the plan and demonstrated an understanding of the instructions.   The patient was advised to call back or seek an  in-person evaluation if the symptoms worsen or if the condition fails to improve as anticipated.  I provided 10 minutes of non-face-to-face time during this encounter.  It was my pleasure to participate in Derik Banh's care today. Please feel free to contact me with any questions or concerns.   Sincerely,  Zimir Kittleson Charmian Muff, MD

## 2021-04-10 ENCOUNTER — Encounter: Payer: Commercial Managed Care - PPO | Admitting: Family Medicine

## 2021-04-10 DIAGNOSIS — J3089 Other allergic rhinitis: Secondary | ICD-10-CM | POA: Diagnosis not present

## 2021-04-10 NOTE — Progress Notes (Signed)
VIALS MADE. EXP 04-10-22

## 2021-04-12 ENCOUNTER — Ambulatory Visit (INDEPENDENT_AMBULATORY_CARE_PROVIDER_SITE_OTHER): Payer: Commercial Managed Care - PPO

## 2021-04-12 ENCOUNTER — Ambulatory Visit (INDEPENDENT_AMBULATORY_CARE_PROVIDER_SITE_OTHER): Payer: Commercial Managed Care - PPO | Admitting: Family Medicine

## 2021-04-12 ENCOUNTER — Other Ambulatory Visit: Payer: Self-pay

## 2021-04-12 ENCOUNTER — Encounter: Payer: Self-pay | Admitting: Family Medicine

## 2021-04-12 VITALS — BP 140/78 | HR 69 | Temp 97.5°F | Ht 76.0 in | Wt 277.8 lb

## 2021-04-12 DIAGNOSIS — E291 Testicular hypofunction: Secondary | ICD-10-CM

## 2021-04-12 DIAGNOSIS — R030 Elevated blood-pressure reading, without diagnosis of hypertension: Secondary | ICD-10-CM | POA: Insufficient documentation

## 2021-04-12 DIAGNOSIS — Z0000 Encounter for general adult medical examination without abnormal findings: Secondary | ICD-10-CM | POA: Diagnosis not present

## 2021-04-12 DIAGNOSIS — J309 Allergic rhinitis, unspecified: Secondary | ICD-10-CM

## 2021-04-12 LAB — CBC
HCT: 52.4 % — ABNORMAL HIGH (ref 39.0–52.0)
Hemoglobin: 17.5 g/dL — ABNORMAL HIGH (ref 13.0–17.0)
MCHC: 33.4 g/dL (ref 30.0–36.0)
MCV: 83.7 fl (ref 78.0–100.0)
Platelets: 248 10*3/uL (ref 150.0–400.0)
RBC: 6.25 Mil/uL — ABNORMAL HIGH (ref 4.22–5.81)
RDW: 14.2 % (ref 11.5–15.5)
WBC: 7.7 10*3/uL (ref 4.0–10.5)

## 2021-04-12 NOTE — Progress Notes (Addendum)
Established Patient Office Visit  Subjective:  Patient ID: Seth Lowery, male    DOB: 11-22-1970  Age: 50 y.o. MRN: [id number]  CC:  Chief Complaint  Patient presents with   Annual Exam    CPE, no concerns. Patient fasting for labs.     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  Patient had been taking testosterone injections weekly instead of every other week.  Since our last visit he has been taking the 200 mg dose of testosterone every other week.  His last dosing was 10 days ago.  He has not noticed much of a difference in his energy levels by taking it every 2 weeks instead of weekly.  He is not averse to becoming a [employer] donor.  Continues to see allergist for desensitization.  Status post colonoscopy.  Polyps were found.  He is on a 3-year cycle.  Status post sleep study, he was diagnosed with mild apnea.  CPAP machine is pending.  Blood work for physical exam was obtained last visit and was essentially normal.  Past Medical History:  Diagnosis Date   Allergy    Chronic cough    from sinusitis   Hyperlipidemia    no meds   Recurrent sinusitis     Past Surgical History:  Procedure Laterality Date   ADENOIDECTOMY     ETHMOIDECTOMY Bilateral 11/07/2020   Procedure: ENDOSCOPIC ETHMOIDECTOMY;  Surgeon: Leta Baptist, MD;  Location: [place];  Service: ENT;  Laterality: Bilateral;   FRONTAL SINUS EXPLORATION Bilateral 11/07/2020   Procedure: ENDOSCOPIC FRONTAL RECESS SINUS EXPLORATION;  Surgeon: Leta Baptist, MD;  Location: [place];  Service: ENT;  Laterality: Bilateral;   MAXILLARY ANTROSTOMY Bilateral 11/07/2020   Procedure: ENDOSCOPIC MAXILLARY ANTROSTOMY WITH TISSUE REMOVAL;  Surgeon: Leta Baptist, MD;  Location: [place];  Service: ENT;  Laterality: Bilateral;   NASAL SINUS SURGERY  08/05/2018   SINUS ENDO WITH FUSION Bilateral 11/07/2020   Procedure: SINUS ENDOSCOPY WITH FUSION  NAVIGATION;  Surgeon: Leta Baptist, MD;  Location: [place];  Service: ENT;  Laterality: Bilateral;   SINUS SURGERY WITH INSTATRAK     SINUS SURGERY WITH INSTATRAK     SPHENOIDECTOMY Bilateral 11/07/2020   Procedure: ENDOSCOPIC SPHENOIDECTOMY WITH TISSUE REMOVAL;  Surgeon: Leta Baptist, MD;  Location: [place];  Service: ENT;  Laterality: Bilateral;   TONSILLECTOMY      Family History  Problem Relation Age of Onset   Healthy Mother    Diabetes Father    Allergic rhinitis Father    Colon polyps Father    Angioedema Neg Hx    Asthma Neg Hx    Atopy Neg Hx    Eczema Neg Hx    Immunodeficiency Neg Hx    Urticaria Neg Hx    Colon cancer Neg Hx    Esophageal cancer Neg Hx    Rectal cancer Neg Hx    Stomach cancer Neg Hx     Social History   Socioeconomic History   Marital status: Married    Spouse name: Not on file   Number of children: Not on file   Years of education: Not on file   Highest education level: Not on file  Occupational History   Not on file  Tobacco Use   Smoking status: Never   Smokeless tobacco: Never  Vaping Use   Vaping Use: Never used  Substance and Sexual Activity   Alcohol use:  Yes    Comment: occ   Drug use: Never   Sexual activity: Yes    Birth control/protection: Condom  Other Topics Concern   Not on file  Social History Narrative   Not on file   Social Determinants of Health   Financial Resource Strain: Not on file  Food Insecurity: Not on file  Transportation Needs: Not on file  Physical Activity: Not on file  Stress: Not on file  Social Connections: Not on file  Intimate Partner Violence: Not on file    Outpatient Medications Prior to Visit  Medication Sig Dispense Refill   ALVESCO 160 MCG/ACT inhaler Inhale 2 puffs into the lungs 2 (two) times daily. 1 each 5   Carbinoxamine Maleate 4 MG/5ML SOLN Take 10 mLs (8 mg total) by mouth in the morning and at bedtime. 600 mL 5   ipratropium (ATROVENT) 0.06 %  nasal spray Place 2 sprays into both nostrils 2 (two) times daily. 15 mL 5   mometasone (NASONEX) 50 MCG/ACT nasal spray Place 2 sprays into the nose daily.     Mupirocin POWD Place 20 mg into the nose in the morning and at bedtime. MIX WITH MOMETASONE 0.'6MG'$      testosterone cypionate (DEPOTESTOSTERONE CYPIONATE) 200 MG/ML injection Inject 200 mg into the muscle every 14 (fourteen) days.     OVER THE COUNTER MEDICATION 2 (two) times daily. [facility] nasal rinse ENT- mupirocin 20 mg/mometasone 0.6 mg/ AMPHC B 5 mg     OVER THE COUNTER MEDICATION Inhale into the lungs in the morning and at bedtime. Adesonide inhaler     No facility-administered medications prior to visit.    No Known Allergies  ROS Review of Systems  Constitutional: Negative.   HENT:  Positive for postnasal drip, rhinorrhea and sneezing.   Eyes:  Negative for photophobia and visual disturbance.  Respiratory:  Positive for apnea.   Cardiovascular: Negative.   Gastrointestinal: Negative.   Genitourinary: Negative.   Musculoskeletal:  Negative for gait problem and joint swelling.  Allergic/Immunologic: Negative for immunocompromised state.  Neurological:  Negative for speech difficulty and weakness.  Psychiatric/Behavioral: Negative.       Objective:    Physical Exam Vitals and nursing note reviewed.  Constitutional:      General: He is not in acute distress.    Appearance: Normal appearance. He is obese. He is not ill-appearing, toxic-appearing or diaphoretic.  HENT:     Head: Normocephalic and atraumatic.     Right Ear: Tympanic membrane, ear canal and external ear normal.     Left Ear: Tympanic membrane, ear canal and external ear normal.     Mouth/Throat:     Mouth: Mucous membranes are moist.     Pharynx: Oropharynx is clear. No oropharyngeal exudate or posterior oropharyngeal erythema.  Eyes:     Extraocular Movements: Extraocular movements intact.     Conjunctiva/sclera: Conjunctivae normal.      Pupils: Pupils are equal, round, and reactive to light.  Neck:     Vascular: No carotid bruit.  Cardiovascular:     Rate and Rhythm: Normal rate and regular rhythm.  Pulmonary:     Effort: Pulmonary effort is normal.     Breath sounds: Decreased air movement present.  Abdominal:     General: Bowel sounds are normal.  Musculoskeletal:     Cervical back: No rigidity or tenderness.  Lymphadenopathy:     Cervical: No cervical adenopathy.  Skin:    General: Skin is warm and dry.  Neurological:     Mental Status: He is alert and oriented to person, place, and time.  Psychiatric:        Mood and Affect: Mood normal.        Behavior: Behavior normal.    BP 140/78   Pulse 69   Temp (!) 97.5 F (36.4 C) (Temporal)   Ht '6\' 4"'$  (1.93 m)   Wt 277 lb 12.8 oz (126 kg)   SpO2 94%   BMI 33.81 kg/m  Wt Readings from Last 3 Encounters:  04/12/21 277 lb 12.8 oz (126 kg)  04/05/21 265 lb (120.2 kg)  03/01/21 279 lb (126.6 kg)     There are no preventive care reminders to display for this patient.  There are no preventive care reminders to display for this patient.  Lab Results  Component Value Date   TSH 1.37 12/16/2020   Lab Results  Component Value Date   WBC 7.7 04/12/2021   HGB 17.5 (H) 04/12/2021   HCT 52.4 (H) 04/12/2021   MCV 83.7 04/12/2021   PLT 248.0 04/12/2021   Lab Results  Component Value Date   NA 139 12/16/2020   K 4.1 12/16/2020   CO2 26 12/16/2020   GLUCOSE 95 12/16/2020   BUN 16 12/16/2020   CREATININE 1.07 12/16/2020   BILITOT 0.8 12/16/2020   ALKPHOS 69 12/16/2020   AST 25 12/16/2020   ALT 44 12/16/2020   PROT 6.7 12/16/2020   ALBUMIN 4.3 12/16/2020   CALCIUM 8.8 12/16/2020   GFR 81.53 12/16/2020   Lab Results  Component Value Date   CHOL 181 12/16/2020   Lab Results  Component Value Date   HDL 51.40 12/16/2020   Lab Results  Component Value Date   LDLCALC 108 (H) 12/16/2020   Lab Results  Component Value Date   TRIG 106.0  12/16/2020   Lab Results  Component Value Date   CHOLHDL 4 12/16/2020   Lab Results  Component Value Date   HGBA1C 5.6 12/16/2020      Assessment & Plan:   Problem List Items Addressed This Visit       Endocrine   Androgen deficiency - Primary   Relevant Medications   testosterone cypionate (DEPOTESTOSTERONE CYPIONATE) 200 MG/ML injection   Other Relevant Orders   CBC (Completed)   Testosterone Total,Free,Bio, Males-([facility]) (Completed)     Other   Healthcare maintenance   Elevated BP without diagnosis of hypertension    Meds ordered this encounter  Medications   testosterone cypionate (DEPOTESTOSTERONE CYPIONATE) 200 MG/ML injection    Sig: Inject 1 mL (200 mg total) into the muscle every 14 (fourteen) days.    Dispense:  10 mL    Refill:  0     Follow-up: Return in about 3 months (around 07/13/2021), or Blood pressure is slightly elevated. Please lose some weight..   Will refill testosterone pending results of CBC.  BP slightly elevated.  We will recheck again in 3 months.  He was given information on health maintenance and disease prevention as well as preventing high blood pressure.  Suggested that he try to lose some weight. [name], MD

## 2021-04-13 DIAGNOSIS — J301 Allergic rhinitis due to pollen: Secondary | ICD-10-CM

## 2021-04-13 LAB — TESTOSTERONE TOTAL,FREE,BIO, MALES
Albumin: 4.2 g/dL (ref 3.6–5.1)
Sex Hormone Binding: 30 nmol/L (ref 10–50)
Testosterone, Bioavailable: 204.5 ng/dL (ref 110.0–575.0)
Testosterone, Free: 106.1 pg/mL (ref 46.0–224.0)
Testosterone: 670 ng/dL (ref 250–827)

## 2021-04-13 MED ORDER — TESTOSTERONE CYPIONATE 200 MG/ML IM SOLN: 200.0000 mg | INTRAMUSCULAR | 0 refills | Status: DC

## 2021-04-13 NOTE — Addendum Note (Signed)
Addended by: Jon Billings on: 04/13/2021 08:08 AM   Modules accepted: Orders

## 2021-04-13 NOTE — Progress Notes (Signed)
Hgb has increased slightly. As discussed, can go to [employer] and donate blood. Please do so. Follow up in three months as planned. Try to exercise and lose some weight as well. This can help lower your blood pressure.

## 2021-04-14 DIAGNOSIS — J302 Other seasonal allergic rhinitis: Secondary | ICD-10-CM

## 2021-04-17 ENCOUNTER — Ambulatory Visit (INDEPENDENT_AMBULATORY_CARE_PROVIDER_SITE_OTHER): Payer: Commercial Managed Care - PPO

## 2021-04-17 DIAGNOSIS — J309 Allergic rhinitis, unspecified: Secondary | ICD-10-CM | POA: Diagnosis not present

## 2021-04-26 ENCOUNTER — Ambulatory Visit (INDEPENDENT_AMBULATORY_CARE_PROVIDER_SITE_OTHER): Payer: Commercial Managed Care - PPO

## 2021-04-26 DIAGNOSIS — J309 Allergic rhinitis, unspecified: Secondary | ICD-10-CM | POA: Diagnosis not present

## 2021-05-01 ENCOUNTER — Ambulatory Visit (INDEPENDENT_AMBULATORY_CARE_PROVIDER_SITE_OTHER): Payer: Commercial Managed Care - PPO

## 2021-05-01 DIAGNOSIS — J309 Allergic rhinitis, unspecified: Secondary | ICD-10-CM | POA: Diagnosis not present

## 2021-05-08 ENCOUNTER — Ambulatory Visit (INDEPENDENT_AMBULATORY_CARE_PROVIDER_SITE_OTHER): Payer: Commercial Managed Care - PPO

## 2021-05-08 DIAGNOSIS — J309 Allergic rhinitis, unspecified: Secondary | ICD-10-CM

## 2021-05-15 ENCOUNTER — Ambulatory Visit (INDEPENDENT_AMBULATORY_CARE_PROVIDER_SITE_OTHER): Payer: Commercial Managed Care - PPO

## 2021-05-15 DIAGNOSIS — J309 Allergic rhinitis, unspecified: Secondary | ICD-10-CM | POA: Diagnosis not present

## 2021-05-23 ENCOUNTER — Ambulatory Visit (INDEPENDENT_AMBULATORY_CARE_PROVIDER_SITE_OTHER): Payer: Commercial Managed Care - PPO

## 2021-05-23 DIAGNOSIS — J309 Allergic rhinitis, unspecified: Secondary | ICD-10-CM

## 2021-05-29 ENCOUNTER — Ambulatory Visit (INDEPENDENT_AMBULATORY_CARE_PROVIDER_SITE_OTHER): Payer: Commercial Managed Care - PPO

## 2021-05-29 DIAGNOSIS — J309 Allergic rhinitis, unspecified: Secondary | ICD-10-CM

## 2021-06-01 ENCOUNTER — Telehealth: Payer: Self-pay | Admitting: Neurology

## 2021-06-01 NOTE — Telephone Encounter (Signed)
Pt stated he has not started his autopap machine. He states that he showed up to be fitted and the power went out and he has not gotten back in touch with [facility] to get this rescheduled. He is wondering if we could contact them for him?   Initial autopap originally scheduled for 9/19 has been cancelled.

## 2021-06-01 NOTE — Telephone Encounter (Signed)
I called pt and he will call to reschedule the appointment.  I gave him the # [phone].  I relayed that will send a message also from out end to give them heads up.  He appreciated call back.

## 2021-06-05 ENCOUNTER — Ambulatory Visit: Payer: Commercial Managed Care - PPO | Admitting: Neurology

## 2021-06-05 ENCOUNTER — Ambulatory Visit (INDEPENDENT_AMBULATORY_CARE_PROVIDER_SITE_OTHER): Payer: Commercial Managed Care - PPO

## 2021-06-05 DIAGNOSIS — J309 Allergic rhinitis, unspecified: Secondary | ICD-10-CM | POA: Diagnosis not present

## 2021-06-05 NOTE — Telephone Encounter (Signed)
Received: 3 days ago [name], RN Thanks!      Previous Messages   ----- Message -----  From: [name], RN  Sent: 06/01/2021  10:13 AM EDT  To: Seth Lowery  Subject: new cpap appt (to be rescheduled due to poweArvella Nigh  Male, 50 y.o., [birth year]  MRN:  [id number]  Phone:  [phone]  Pt had appt but due to power outage, had to be rescheduled.  He had not heard back.  I gave him # to call and set up.  This is just heads up.     Thanks  [employer]

## 2021-06-14 ENCOUNTER — Ambulatory Visit (INDEPENDENT_AMBULATORY_CARE_PROVIDER_SITE_OTHER): Payer: Commercial Managed Care - PPO

## 2021-06-14 DIAGNOSIS — J309 Allergic rhinitis, unspecified: Secondary | ICD-10-CM | POA: Diagnosis not present

## 2021-06-17 ENCOUNTER — Other Ambulatory Visit: Payer: Self-pay | Admitting: Allergy

## 2021-06-22 ENCOUNTER — Ambulatory Visit (INDEPENDENT_AMBULATORY_CARE_PROVIDER_SITE_OTHER): Payer: Commercial Managed Care - PPO

## 2021-06-22 DIAGNOSIS — J309 Allergic rhinitis, unspecified: Secondary | ICD-10-CM

## 2021-06-26 ENCOUNTER — Ambulatory Visit (INDEPENDENT_AMBULATORY_CARE_PROVIDER_SITE_OTHER): Payer: Commercial Managed Care - PPO

## 2021-06-26 DIAGNOSIS — J309 Allergic rhinitis, unspecified: Secondary | ICD-10-CM | POA: Diagnosis not present

## 2021-07-04 ENCOUNTER — Ambulatory Visit (INDEPENDENT_AMBULATORY_CARE_PROVIDER_SITE_OTHER): Payer: Commercial Managed Care - PPO

## 2021-07-04 DIAGNOSIS — J309 Allergic rhinitis, unspecified: Secondary | ICD-10-CM

## 2021-07-13 ENCOUNTER — Ambulatory Visit (INDEPENDENT_AMBULATORY_CARE_PROVIDER_SITE_OTHER): Payer: Commercial Managed Care - PPO | Admitting: Family Medicine

## 2021-07-13 ENCOUNTER — Encounter: Payer: Self-pay | Admitting: Family Medicine

## 2021-07-13 ENCOUNTER — Other Ambulatory Visit: Payer: Self-pay

## 2021-07-13 ENCOUNTER — Other Ambulatory Visit: Payer: Self-pay | Admitting: Family Medicine

## 2021-07-13 VITALS — BP 132/78 | HR 64 | Temp 97.1°F | Ht 76.0 in | Wt 244.4 lb

## 2021-07-13 DIAGNOSIS — D751 Secondary polycythemia: Secondary | ICD-10-CM

## 2021-07-13 DIAGNOSIS — Z23 Encounter for immunization: Secondary | ICD-10-CM | POA: Diagnosis not present

## 2021-07-13 DIAGNOSIS — E291 Testicular hypofunction: Secondary | ICD-10-CM | POA: Diagnosis not present

## 2021-07-13 LAB — CBC
HCT: 48.8 % (ref 39.0–52.0)
Hemoglobin: 16.3 g/dL (ref 13.0–17.0)
MCHC: 33.5 g/dL (ref 30.0–36.0)
MCV: 84 fl (ref 78.0–100.0)
Platelets: 269 10*3/uL (ref 150.0–400.0)
RBC: 5.81 Mil/uL (ref 4.22–5.81)
RDW: 14.2 % (ref 11.5–15.5)
WBC: 7.9 10*3/uL (ref 4.0–10.5)

## 2021-07-13 NOTE — Progress Notes (Signed)
Established Patient Office Visit  Subjective:  Patient ID: Seth Lowery, male    DOB: 1971-06-14  Age: 50 y.o. MRN: [id number]  CC:  Chief Complaint  Patient presents with   Follow-up    3 month follow no concerns. Patient not fasting.     HPI Seth Lowery presents for follow-up of androgen deficiency and erythrocytosis.  Has been able to lose 33 pounds by monitoring caloric intake.  Feels great.  There is less fatigue.  He is no longer snoring.  Donated blood twice at the [facility].  Sinuses are improving status post extraction of nasal polyps and care through his allergist.  Past Medical History:  Diagnosis Date   Allergy    Chronic cough    from sinusitis   Hyperlipidemia    no meds   Recurrent sinusitis     Past Surgical History:  Procedure Laterality Date   ADENOIDECTOMY     ETHMOIDECTOMY Bilateral 11/07/2020   Procedure: ENDOSCOPIC ETHMOIDECTOMY;  Surgeon: Leta Baptist, MD;  Location: [place];  Service: ENT;  Laterality: Bilateral;   FRONTAL SINUS EXPLORATION Bilateral 11/07/2020   Procedure: ENDOSCOPIC FRONTAL RECESS SINUS EXPLORATION;  Surgeon: Leta Baptist, MD;  Location: [place];  Service: ENT;  Laterality: Bilateral;   MAXILLARY ANTROSTOMY Bilateral 11/07/2020   Procedure: ENDOSCOPIC MAXILLARY ANTROSTOMY WITH TISSUE REMOVAL;  Surgeon: Leta Baptist, MD;  Location: [place];  Service: ENT;  Laterality: Bilateral;   NASAL SINUS SURGERY  08/05/2018   SINUS ENDO WITH FUSION Bilateral 11/07/2020   Procedure: SINUS ENDOSCOPY WITH FUSION NAVIGATION;  Surgeon: Leta Baptist, MD;  Location: [place];  Service: ENT;  Laterality: Bilateral;   SINUS SURGERY WITH INSTATRAK     SINUS SURGERY WITH INSTATRAK     SPHENOIDECTOMY Bilateral 11/07/2020   Procedure: ENDOSCOPIC SPHENOIDECTOMY WITH TISSUE REMOVAL;  Surgeon: Leta Baptist, MD;  Location: [place];  Service: ENT;  Laterality: Bilateral;   TONSILLECTOMY       Family History  Problem Relation Age of Onset   Healthy Mother    Diabetes Father    Allergic rhinitis Father    Colon polyps Father    Angioedema Neg Hx    Asthma Neg Hx    Atopy Neg Hx    Eczema Neg Hx    Immunodeficiency Neg Hx    Urticaria Neg Hx    Colon cancer Neg Hx    Esophageal cancer Neg Hx    Rectal cancer Neg Hx    Stomach cancer Neg Hx     Social History   Socioeconomic History   Marital status: Married    Spouse name: Not on file   Number of children: Not on file   Years of education: Not on file   Highest education level: Not on file  Occupational History   Not on file  Tobacco Use   Smoking status: Never   Smokeless tobacco: Never  Vaping Use   Vaping Use: Never used  Substance and Sexual Activity   Alcohol use: Yes    Comment: occ   Drug use: Never   Sexual activity: Yes    Birth control/protection: Condom  Other Topics Concern   Not on file  Social History Narrative   Not on file   Social Determinants of Health   Financial Resource Strain: Not on file  Food Insecurity: Not on file  Transportation Needs: Not on file  Physical Activity: Not on file  Stress: Not on  file  Social Connections: Not on file  Intimate Partner Violence: Not on file    Outpatient Medications Prior to Visit  Medication Sig Dispense Refill   ALVESCO 160 MCG/ACT inhaler Inhale 2 puffs into the lungs 2 (two) times daily. 1 each 5   Carbinoxamine Maleate 4 MG/5ML SOLN Take 10 mLs (8 mg total) by mouth in the morning and at bedtime. 600 mL 5   ipratropium (ATROVENT) 0.06 % nasal spray Place 2 sprays into both nostrils 2 (two) times daily. 15 mL 5   Mupirocin POWD Place 20 mg into the nose in the morning and at bedtime. MIX WITH MOMETASONE 0.6MG      testosterone cypionate (DEPOTESTOSTERONE CYPIONATE) 200 MG/ML injection Inject 1 mL (200 mg total) into the muscle every 14 (fourteen) days. 10 mL 0   albuterol (VENTOLIN HFA) 108 (90 Base) MCG/ACT inhaler INHALE 2 PUFFS  INTO THE LUNGS EVERY 4 HOURS AS NEEDED FOR WHEEZING OR SHORTNESS OF BREATH. 8.5 each 1   mometasone (NASONEX) 50 MCG/ACT nasal spray Place 2 sprays into the nose daily.     No facility-administered medications prior to visit.    No Known Allergies  ROS Review of Systems  Constitutional:  Negative for diaphoresis, fatigue, fever and unexpected weight change.  HENT:  Positive for congestion and postnasal drip.   Eyes:  Negative for photophobia and visual disturbance.  Respiratory: Negative.    Cardiovascular: Negative.   Gastrointestinal: Negative.   Genitourinary: Negative.   Neurological:  Negative for speech difficulty and weakness.  Psychiatric/Behavioral: Negative.       Objective:    Physical Exam Vitals and nursing note reviewed.  Constitutional:      General: He is not in acute distress.    Appearance: Normal appearance. He is not ill-appearing, toxic-appearing or diaphoretic.  HENT:     Head: Normocephalic and atraumatic.     Right Ear: Tympanic membrane, ear canal and external ear normal.     Left Ear: Tympanic membrane, ear canal and external ear normal.     Mouth/Throat:     Mouth: Mucous membranes are moist.     Pharynx: Oropharynx is clear. No oropharyngeal exudate or posterior oropharyngeal erythema.  Neck:     Vascular: No carotid bruit.  Cardiovascular:     Rate and Rhythm: Normal rate and regular rhythm.  Pulmonary:     Effort: Pulmonary effort is normal.     Breath sounds: Normal breath sounds.  Musculoskeletal:     Cervical back: No rigidity or tenderness.  Lymphadenopathy:     Cervical: No cervical adenopathy.  Skin:    General: Skin is warm and dry.  Neurological:     Mental Status: He is alert and oriented to person, place, and time.  Psychiatric:        Mood and Affect: Mood normal.        Behavior: Behavior normal.    BP 132/78 (BP Location: Left Arm, Patient Position: Sitting, Cuff Size: Normal)   Pulse 64   Temp (!) 97.1 F (36.2 C)  (Temporal)   Ht 6\' 4"  (1.93 m)   Wt 244 lb 6.4 oz (110.9 kg)   SpO2 96%   BMI 29.75 kg/m  Wt Readings from Last 3 Encounters:  07/13/21 244 lb 6.4 oz (110.9 kg)  04/12/21 277 lb 12.8 oz (126 kg)  04/05/21 265 lb (120.2 kg)     Health Maintenance Due  Topic Date Due   INFLUENZA VACCINE  04/17/2021    There are  no preventive care reminders to display for this patient.  Lab Results  Component Value Date   TSH 1.37 12/16/2020   Lab Results  Component Value Date   WBC 7.7 04/12/2021   HGB 17.5 (H) 04/12/2021   HCT 52.4 (H) 04/12/2021   MCV 83.7 04/12/2021   PLT 248.0 04/12/2021   Lab Results  Component Value Date   NA 139 12/16/2020   K 4.1 12/16/2020   CO2 26 12/16/2020   GLUCOSE 95 12/16/2020   BUN 16 12/16/2020   CREATININE 1.07 12/16/2020   BILITOT 0.8 12/16/2020   ALKPHOS 69 12/16/2020   AST 25 12/16/2020   ALT 44 12/16/2020   PROT 6.7 12/16/2020   ALBUMIN 4.3 12/16/2020   CALCIUM 8.8 12/16/2020   GFR 81.53 12/16/2020   Lab Results  Component Value Date   CHOL 181 12/16/2020   Lab Results  Component Value Date   HDL 51.40 12/16/2020   Lab Results  Component Value Date   LDLCALC 108 (H) 12/16/2020   Lab Results  Component Value Date   TRIG 106.0 12/16/2020   Lab Results  Component Value Date   CHOLHDL 4 12/16/2020   Lab Results  Component Value Date   HGBA1C 5.6 12/16/2020      Assessment & Plan:   Problem List Items Addressed This Visit       Endocrine   Androgen deficiency - Primary   Relevant Orders   Testosterone Total,Free,Bio, Males-([facility])     Other   Other erythrocytosis   Relevant Orders   CBC    No orders of the defined types were placed in this encounter.   Follow-up: No follow-ups on file.  Rechecking testosterone and hemoglobin levels today.  Congratulated him on his weight loss and encouraged him to continue.  [name], MD

## 2021-07-14 LAB — TESTOSTERONE TOTAL,FREE,BIO, MALES
Albumin: 4.4 g/dL (ref 3.6–5.1)
Sex Hormone Binding: 28 nmol/L (ref 10–50)
Testosterone, Bioavailable: 355.1 ng/dL (ref 110.0–575.0)
Testosterone, Free: 176.4 pg/mL (ref 46.0–224.0)
Testosterone: 968 ng/dL — ABNORMAL HIGH (ref 250–827)

## 2021-07-18 DIAGNOSIS — J3089 Other allergic rhinitis: Secondary | ICD-10-CM | POA: Diagnosis not present

## 2021-07-19 NOTE — Progress Notes (Signed)
VIALS MADE. EXP 07-19-22 

## 2021-07-20 ENCOUNTER — Ambulatory Visit (INDEPENDENT_AMBULATORY_CARE_PROVIDER_SITE_OTHER): Payer: Commercial Managed Care - PPO

## 2021-07-20 DIAGNOSIS — J309 Allergic rhinitis, unspecified: Secondary | ICD-10-CM

## 2021-07-26 ENCOUNTER — Ambulatory Visit (INDEPENDENT_AMBULATORY_CARE_PROVIDER_SITE_OTHER): Payer: Commercial Managed Care - PPO

## 2021-07-26 DIAGNOSIS — J309 Allergic rhinitis, unspecified: Secondary | ICD-10-CM | POA: Diagnosis not present

## 2021-08-04 ENCOUNTER — Ambulatory Visit (INDEPENDENT_AMBULATORY_CARE_PROVIDER_SITE_OTHER): Payer: Commercial Managed Care - PPO

## 2021-08-04 DIAGNOSIS — J309 Allergic rhinitis, unspecified: Secondary | ICD-10-CM

## 2021-08-07 ENCOUNTER — Ambulatory Visit (INDEPENDENT_AMBULATORY_CARE_PROVIDER_SITE_OTHER): Payer: Commercial Managed Care - PPO

## 2021-08-07 DIAGNOSIS — J309 Allergic rhinitis, unspecified: Secondary | ICD-10-CM | POA: Diagnosis not present

## 2021-08-18 ENCOUNTER — Ambulatory Visit (INDEPENDENT_AMBULATORY_CARE_PROVIDER_SITE_OTHER): Payer: Commercial Managed Care - PPO

## 2021-08-18 DIAGNOSIS — J309 Allergic rhinitis, unspecified: Secondary | ICD-10-CM

## 2021-08-23 ENCOUNTER — Ambulatory Visit: Payer: Self-pay

## 2021-08-23 ENCOUNTER — Other Ambulatory Visit: Payer: Self-pay

## 2021-08-23 ENCOUNTER — Ambulatory Visit (INDEPENDENT_AMBULATORY_CARE_PROVIDER_SITE_OTHER): Payer: Commercial Managed Care - PPO | Admitting: Allergy

## 2021-08-23 ENCOUNTER — Encounter: Payer: Self-pay | Admitting: Allergy

## 2021-08-23 VITALS — BP 136/78 | HR 70 | Temp 97.7°F | Resp 12 | Ht 76.0 in | Wt 234.4 lb

## 2021-08-23 DIAGNOSIS — J309 Allergic rhinitis, unspecified: Secondary | ICD-10-CM

## 2021-08-23 DIAGNOSIS — R062 Wheezing: Secondary | ICD-10-CM | POA: Diagnosis not present

## 2021-08-23 DIAGNOSIS — R053 Chronic cough: Secondary | ICD-10-CM | POA: Diagnosis not present

## 2021-08-23 DIAGNOSIS — J3089 Other allergic rhinitis: Secondary | ICD-10-CM

## 2021-08-23 NOTE — Patient Instructions (Addendum)
Seasonal and perennial allergic rhinitis Continue allergy injections per protocol.  Discussed once he gets to every 3-4 week interval will discuss if he can potentially discontinue. Continue Carbinaxomine 4mg /51ml take 78ml twice a day.  This is an antihistamine that is working well for you at this time. Continue Atrovent nasal spray 2 sprays each nostril twice a day (can be used up to 3-4 times a day as needed for nasal drainage control).  If nasal passage is getting too dry then decrease and/or stop use Continue your nasal rinse regimen with mometasone and mupirocin as directed by Dr. [name]  Cough persistent and Wheeze Have access to albuterol inhaler 2 puffs every 4-6 hours as needed for cough/wheeze/shortness of breath/chest tightness.  May use 15-20 minutes prior to activity.   Monitor frequency of use.   Continue Alvesco inhaler 1 puffs twice a day.  If he continues with good control then next visit will plan to step down to Alvesco 51mcg 1 puff twice a day use  Follow-up in 6 months or sooner if needed

## 2021-08-23 NOTE — Progress Notes (Signed)
Follow-up Note  RE: Seth Lowery MRN: [id number] DOB: [birth year] Date of Office Visit: 08/23/2021   History of present illness: Seth Lowery is a 50 y.o. male presenting today for follow-up of allergic rhinitis and persistent cough/wheeze.  He was last seen in the office on 04/05/21 by myself.  He has been doing well since last visit.  He states his current regimen is working well for his at this time.  He states he did with his ENT recommendations try to stop his regimen to see how he does and just perform nasal rinses.  Thus he did stop the carbinaxomine, nasal atrovent and alvesco and his symptoms returned full force.  He restarted the regimen and states it took several weeks to be back under control.  He is taking carbinaxomine twice a day as well as nasal atrovent.  He is using alvesco inhaler and did decrease down to 1 puff twice a day without any return of symptoms.  He has not had any ED/UC visits or any systemic steroid needs.   He is on allergen immunotherapy in red vial currently at weekly injections and is tolerating injections without large local or systemic symptoms.   Review of systems: Review of Systems  Constitutional: Negative.   HENT: Negative.    Eyes: Negative.   Respiratory: Negative.    Cardiovascular: Negative.   Musculoskeletal: Negative.   Skin: Negative.   Allergic/Immunologic: Negative.   Neurological: Negative.     All other systems negative unless noted above in HPI  Past medical/social/surgical/family history have been reviewed and are unchanged unless specifically indicated below.  No changes  Medication List: Current Outpatient Medications  Medication Sig Dispense Refill  . ALVESCO 160 MCG/ACT inhaler Inhale 2 puffs into the lungs 2 (two) times daily. (Patient taking differently: Inhale 1 puff into the lungs 2 (two) times daily.) 1 each 5  . Carbinoxamine Maleate 4 MG/5ML SOLN Take 10 mLs (8 mg total) by mouth in the morning and at bedtime. 600 mL  5  . ipratropium (ATROVENT) 0.06 % nasal spray Place 2 sprays into both nostrils 2 (two) times daily. 15 mL 5  . Mupirocin POWD Place 20 mg into the nose in the morning and at bedtime. MIX WITH MOMETASONE 0.6MG     . testosterone cypionate (DEPOTESTOSTERONE CYPIONATE) 200 MG/ML injection INJECT 1 ML (200 MG TOTAL) INTO THE MUSCLE EVERY 14 (FOURTEEN) DAYS. 6 mL 1   No current facility-administered medications for this visit.     Known medication allergies: No Known Allergies   Physical examination: Blood pressure 136/78, pulse 70, temperature 97.7 F (36.5 C), temperature source Temporal, resp. rate 12, height 6\' 4"  (1.93 m), weight 234 lb 6.4 oz (106.3 kg), SpO2 96 %.  General: Alert, interactive, in no acute distress. HEENT: PERRLA, TMs pearly gray, turbinates non-edematous without discharge, post-pharynx non erythematous. Neck: Supple without lymphadenopathy. Lungs: Clear to auscultation without wheezing, rhonchi or rales. {no increased work of breathing. CV: Normal S1, S2 without murmurs. Abdomen: Nondistended, nontender. Skin: Warm and dry, without lesions or rashes. Extremities:  No clubbing, cyanosis or edema. Neuro:   Grossly intact.  Diagnositics/Labs:  Spirometry: FEV1: 3.45L 73%, FVC: 4.7L 77% which is stable from previous study  Assessment and plan:   Seasonal and perennial allergic rhinitis Continue allergy injections per protocol.  Discussed once he gets to every 3-4 week interval will discuss if he can potentially discontinue. Continue Carbinaxomine 4mg /50ml take 45ml twice a day.  This is an antihistamine that is working  well for you at this time. Continue Atrovent nasal spray 2 sprays each nostril twice a day (can be used up to 3-4 times a day as needed for nasal drainage control).  If nasal passage is getting too dry then decrease and/or stop use Continue your nasal rinse regimen with mometasone and mupirocin as directed by Dr. [name]  Cough persistent and  Wheeze Have access to albuterol inhaler 2 puffs every 4-6 hours as needed for cough/wheeze/shortness of breath/chest tightness.  May use 15-20 minutes prior to activity.   Monitor frequency of use.   Continue Alvesco inhaler 1 puffs twice a day.  If he continues with good control then next visit will plan to step down to Alvesco 60mcg 1 puff twice a day use  Follow-up in 6 months or sooner if needed   I appreciate the opportunity to take part in Seth Lowery's care. Please do not hesitate to contact me with questions.  Sincerely,   Prudy Feeler, MD Allergy/Immunology [facility]

## 2021-09-01 ENCOUNTER — Ambulatory Visit (INDEPENDENT_AMBULATORY_CARE_PROVIDER_SITE_OTHER): Payer: Commercial Managed Care - PPO

## 2021-09-01 DIAGNOSIS — J309 Allergic rhinitis, unspecified: Secondary | ICD-10-CM | POA: Diagnosis not present

## 2021-09-04 ENCOUNTER — Ambulatory Visit (INDEPENDENT_AMBULATORY_CARE_PROVIDER_SITE_OTHER): Payer: Commercial Managed Care - PPO

## 2021-09-04 DIAGNOSIS — J309 Allergic rhinitis, unspecified: Secondary | ICD-10-CM

## 2021-09-21 ENCOUNTER — Ambulatory Visit (INDEPENDENT_AMBULATORY_CARE_PROVIDER_SITE_OTHER): Payer: Commercial Managed Care - PPO

## 2021-09-21 DIAGNOSIS — J309 Allergic rhinitis, unspecified: Secondary | ICD-10-CM

## 2021-09-26 ENCOUNTER — Ambulatory Visit (INDEPENDENT_AMBULATORY_CARE_PROVIDER_SITE_OTHER): Payer: Commercial Managed Care - PPO

## 2021-09-26 DIAGNOSIS — J309 Allergic rhinitis, unspecified: Secondary | ICD-10-CM | POA: Diagnosis not present

## 2021-09-28 DIAGNOSIS — J3089 Other allergic rhinitis: Secondary | ICD-10-CM | POA: Diagnosis not present

## 2021-09-28 NOTE — Progress Notes (Signed)
VIALS MADE. EXP 09-28-22

## 2021-09-29 ENCOUNTER — Other Ambulatory Visit: Payer: Self-pay | Admitting: Allergy

## 2021-10-06 ENCOUNTER — Ambulatory Visit (INDEPENDENT_AMBULATORY_CARE_PROVIDER_SITE_OTHER): Payer: Commercial Managed Care - PPO

## 2021-10-06 DIAGNOSIS — J309 Allergic rhinitis, unspecified: Secondary | ICD-10-CM

## 2021-10-13 ENCOUNTER — Ambulatory Visit (INDEPENDENT_AMBULATORY_CARE_PROVIDER_SITE_OTHER): Payer: Commercial Managed Care - PPO | Admitting: *Deleted

## 2021-10-13 DIAGNOSIS — J309 Allergic rhinitis, unspecified: Secondary | ICD-10-CM | POA: Diagnosis not present

## 2021-10-16 ENCOUNTER — Ambulatory Visit (INDEPENDENT_AMBULATORY_CARE_PROVIDER_SITE_OTHER): Payer: Commercial Managed Care - PPO

## 2021-10-16 DIAGNOSIS — J309 Allergic rhinitis, unspecified: Secondary | ICD-10-CM

## 2021-10-23 ENCOUNTER — Ambulatory Visit (INDEPENDENT_AMBULATORY_CARE_PROVIDER_SITE_OTHER): Payer: Commercial Managed Care - PPO

## 2021-10-23 DIAGNOSIS — J309 Allergic rhinitis, unspecified: Secondary | ICD-10-CM

## 2021-10-28 ENCOUNTER — Other Ambulatory Visit: Payer: Self-pay | Admitting: Allergy

## 2021-11-22 ENCOUNTER — Ambulatory Visit (INDEPENDENT_AMBULATORY_CARE_PROVIDER_SITE_OTHER): Payer: Commercial Managed Care - PPO

## 2021-11-22 DIAGNOSIS — J309 Allergic rhinitis, unspecified: Secondary | ICD-10-CM | POA: Diagnosis not present

## 2021-11-27 ENCOUNTER — Ambulatory Visit (INDEPENDENT_AMBULATORY_CARE_PROVIDER_SITE_OTHER): Payer: Commercial Managed Care - PPO | Admitting: *Deleted

## 2021-11-27 DIAGNOSIS — J309 Allergic rhinitis, unspecified: Secondary | ICD-10-CM

## 2021-12-04 ENCOUNTER — Ambulatory Visit (INDEPENDENT_AMBULATORY_CARE_PROVIDER_SITE_OTHER): Payer: Commercial Managed Care - PPO

## 2021-12-04 DIAGNOSIS — J309 Allergic rhinitis, unspecified: Secondary | ICD-10-CM

## 2021-12-11 ENCOUNTER — Ambulatory Visit (INDEPENDENT_AMBULATORY_CARE_PROVIDER_SITE_OTHER): Payer: Commercial Managed Care - PPO | Admitting: *Deleted

## 2021-12-11 DIAGNOSIS — J309 Allergic rhinitis, unspecified: Secondary | ICD-10-CM

## 2021-12-25 ENCOUNTER — Ambulatory Visit (INDEPENDENT_AMBULATORY_CARE_PROVIDER_SITE_OTHER): Payer: Commercial Managed Care - PPO

## 2021-12-25 DIAGNOSIS — J309 Allergic rhinitis, unspecified: Secondary | ICD-10-CM | POA: Diagnosis not present

## 2021-12-27 DIAGNOSIS — J3089 Other allergic rhinitis: Secondary | ICD-10-CM | POA: Diagnosis not present

## 2021-12-27 NOTE — Progress Notes (Signed)
EXP 12/28/22 ?

## 2022-01-09 ENCOUNTER — Other Ambulatory Visit: Payer: Self-pay | Admitting: Allergy

## 2022-01-12 ENCOUNTER — Ambulatory Visit (INDEPENDENT_AMBULATORY_CARE_PROVIDER_SITE_OTHER): Payer: Commercial Managed Care - PPO

## 2022-01-12 DIAGNOSIS — J309 Allergic rhinitis, unspecified: Secondary | ICD-10-CM

## 2022-01-22 ENCOUNTER — Other Ambulatory Visit: Payer: Self-pay | Admitting: Allergy

## 2022-01-26 ENCOUNTER — Ambulatory Visit (INDEPENDENT_AMBULATORY_CARE_PROVIDER_SITE_OTHER): Payer: Commercial Managed Care - PPO

## 2022-01-26 DIAGNOSIS — J309 Allergic rhinitis, unspecified: Secondary | ICD-10-CM

## 2022-02-01 ENCOUNTER — Ambulatory Visit (INDEPENDENT_AMBULATORY_CARE_PROVIDER_SITE_OTHER): Payer: Commercial Managed Care - PPO

## 2022-02-01 DIAGNOSIS — J309 Allergic rhinitis, unspecified: Secondary | ICD-10-CM

## 2022-02-09 ENCOUNTER — Other Ambulatory Visit: Payer: Self-pay | Admitting: Allergy

## 2022-02-13 ENCOUNTER — Ambulatory Visit (INDEPENDENT_AMBULATORY_CARE_PROVIDER_SITE_OTHER): Payer: Commercial Managed Care - PPO

## 2022-02-13 DIAGNOSIS — J309 Allergic rhinitis, unspecified: Secondary | ICD-10-CM | POA: Diagnosis not present

## 2022-02-20 ENCOUNTER — Encounter: Payer: Self-pay | Admitting: Family Medicine

## 2022-02-20 DIAGNOSIS — E291 Testicular hypofunction: Secondary | ICD-10-CM

## 2022-02-21 MED ORDER — TESTOSTERONE CYPIONATE 200 MG/ML IM SOLN: 200.0000 mg | INTRAMUSCULAR | 1 refills | Status: DC

## 2022-02-21 NOTE — Progress Notes (Signed)
FOLLOW UP Date of Service/Encounter:  02/22/22  Subjective:  Seth Lowery (DOB: 26-Aug-1971) is a 51 y.o. male who returns to the [facility] on 02/22/2022 in re-evaluation of the following: allergic rhinitis and persistent cough/wheeze History obtained from: chart review and patient.  For Review, LV was on 08/23/21  with Dr. [name] seen for routine follow-up.  He had trialed off his asthma and allergy medications per ENT recommendations, but symptoms returned so he restarted.  FEV1 73% at last visit.  On AIT and tolerating.    Started AIT 05/30/20. Currently at maintenance dose coming q2 weeks. 2 vials-weeds trees #1, grass, ragweed, dust mites, dog #2 2018 Chest CT: Fatty infiltration of the liver. No significant abnormality is seen in the chest. 2022 CT sinus: 1. Moderate to severe mucosal thickening throughout the paranasal sinuses with fluid levels in the maxillary sinuses potentially indicating acute on chronic sinusitis. 2. Small left mastoid effusion. Previous spirometrys all showing possible restriction, no obstruction.  Clinically improved with ICS.  Today presents for follow-up. Doing great, best he has been in 4 years.  He feels the allergy injections are working.  He is tolerating them well without symptoms.  Cough/wheeze: he is using Alvesco 1 puff once a day and doing well on this.  Not needing his rescue.  Wonders if he could decrease to once per day.    Allergic rhinitis: He is using nasal rinses prescribed by Dr. [name] as needed. And Atrovent using 1 spray twice a day.He continues to have PND.  This has been more manegeable. PND usually worse during middle part of day no matter what exposures he has, activity he is doing, doesn't matter the time zone.  This remains a mystery to him.      Allergies as of 02/22/2022   No Known Allergies      Medication List        Accurate as of February 22, 2022  1:31 PM. If you have any questions, ask your nurse or doctor.           Alvesco 160 MCG/ACT inhaler Generic drug: ciclesonide INHALE 1 PUFF INTO THE LUNGS 2 (TWO) TIMES DAILY. RINSE, GARGLE AND SPIT OUT AFTER USE   Carbinoxamine Maleate 4 MG/5ML Soln TAKE 10 MLS BY MOUTH IN THE MORNING AND AT BEDTIME.   ipratropium 0.06 % nasal spray Commonly known as: ATROVENT Place 2 sprays into both nostrils 2 (two) times daily.   levocetirizine 5 MG tablet Commonly known as: XYZAL TAKE 1 TABLET BY MOUTH DAILY AS NEEDED FOR ALLERGIES.   Mupirocin Powd Place 20 mg into the nose in the morning and at bedtime. MIX WITH MOMETASONE 0.'6MG'$    testosterone cypionate 200 MG/ML injection Commonly known as: DEPOTESTOSTERONE CYPIONATE Inject 1 mL (200 mg total) into the muscle every 14 (fourteen) days.       Past Medical History:  Diagnosis Date   Allergy    Chronic cough    from sinusitis   Hyperlipidemia    no meds   Recurrent sinusitis    Past Surgical History:  Procedure Laterality Date   ADENOIDECTOMY     ETHMOIDECTOMY Bilateral 11/07/2020   Procedure: ENDOSCOPIC ETHMOIDECTOMY;  Surgeon: Leta Baptist, MD;  Location: [place];  Service: ENT;  Laterality: Bilateral;   FRONTAL SINUS EXPLORATION Bilateral 11/07/2020   Procedure: ENDOSCOPIC FRONTAL RECESS SINUS EXPLORATION;  Surgeon: Leta Baptist, MD;  Location: [place];  Service: ENT;  Laterality: Bilateral;   MAXILLARY ANTROSTOMY  Bilateral 11/07/2020   Procedure: ENDOSCOPIC MAXILLARY ANTROSTOMY WITH TISSUE REMOVAL;  Surgeon: Leta Baptist, MD;  Location: [place];  Service: ENT;  Laterality: Bilateral;   NASAL SINUS SURGERY  08/05/2018   SINUS ENDO WITH FUSION Bilateral 11/07/2020   Procedure: SINUS ENDOSCOPY WITH FUSION NAVIGATION;  Surgeon: Leta Baptist, MD;  Location: [place];  Service: ENT;  Laterality: Bilateral;   SINUS SURGERY WITH INSTATRAK     SINUS SURGERY WITH INSTATRAK     SPHENOIDECTOMY Bilateral 11/07/2020   Procedure: ENDOSCOPIC  SPHENOIDECTOMY WITH TISSUE REMOVAL;  Surgeon: Leta Baptist, MD;  Location: [place];  Service: ENT;  Laterality: Bilateral;   TONSILLECTOMY     Otherwise, there have been no changes to his past medical history, surgical history, family history, or social history.  ROS: All others negative except as noted per HPI.   Objective:  BP 126/74   Pulse 67   Temp 97.8 F (36.6 C) (Temporal)   Resp 18   Wt 256 lb 14.4 oz (116.5 kg)   SpO2 93%   BMI 31.27 kg/m  Body mass index is 31.27 kg/m. Physical Exam: General Appearance:  Alert, cooperative, no distress, appears stated age  Head:  Normocephalic, without obvious abnormality, atraumatic  Eyes:  Conjunctiva clear, EOM's intact  Nose: Nares normal, normal mucosa and no visible anterior polyps  Throat: Lips, tongue normal; teeth and gums normal, normal posterior oropharynx  Neck: Supple, symmetrical  Lungs:   clear to auscultation bilaterally, Respirations unlabored, no coughing  Heart:  regular rate and rhythm and no murmur, Appears well perfused  Extremities: No edema  Skin: Skin color, texture, turgor normal, no rashes or lesions on visualized portions of skin  Neurologic: No gross deficits  Spirometry:  Tracings reviewed. His effort: Good reproducible efforts. FVC: 4.23L FEV1: 3.25L, 69% predicted FEV1/FVC ratio: 99% Interpretation: Spirometry consistent with possible restrictive disease similar to prior Please see scanned spirometry results for details.  Assessment/Plan  Symptomatically he feels controlled.  Discussed trial of stepping down current medications to see how he does.  Plan- try stopping nighttime doses of atrovent and alvesco. If symptoms worsen, return to twice daily dosing.  Seasonal and perennial allergic rhinitis Continue allergy injections per protocol and bring epipen to your appointments.  Continue Carbinaxomine '4mg'$ /71m take 17mtwice a day as needed.  Continue Atrovent nasal spray 2 sprays  each nostril daily (can be used up to 3-4 times a day as needed for nasal drainage control).  If nasal passage is getting too dry then decrease and/or stop use Continue your nasal rinse regimen with mometasone and mupirocin as directed by Dr. TeBenjamine MolaCough persistent and Wheeze Have access to albuterol inhaler 2 puffs every 4-6 hours as needed for cough/wheeze/shortness of breath/chest tightness.  May use 15-20 minutes prior to activity.   Monitor frequency of use.   Continue Alvesco inhaler 1 puffs daily. Return to twice daily dosing if symptoms worsen.  Follow-up in 6 months or sooner if needed. It was a pleasure meeting you today!   ErSigurd SosMD  [facility]

## 2022-02-22 ENCOUNTER — Encounter: Payer: Self-pay | Admitting: Internal Medicine

## 2022-02-22 ENCOUNTER — Ambulatory Visit: Payer: Commercial Managed Care - PPO | Admitting: Internal Medicine

## 2022-02-22 VITALS — BP 126/74 | HR 67 | Temp 97.8°F | Resp 18 | Wt 256.9 lb

## 2022-02-22 DIAGNOSIS — J309 Allergic rhinitis, unspecified: Secondary | ICD-10-CM | POA: Diagnosis not present

## 2022-02-22 DIAGNOSIS — R062 Wheezing: Secondary | ICD-10-CM

## 2022-02-22 MED ORDER — IPRATROPIUM BROMIDE 0.06 % NA SOLN
2.0000 | Freq: Two times a day (BID) | NASAL | 5 refills | Status: DC
Start: 2022-02-22 — End: 2023-02-07

## 2022-02-22 MED ORDER — EPINEPHRINE 0.3 MG/0.3ML IJ SOAJ: 0.3000 mg | Freq: Once | INTRAMUSCULAR | 2 refills | Status: DC | PRN

## 2022-02-22 MED ORDER — LEVOCETIRIZINE DIHYDROCHLORIDE 5 MG PO TABS
ORAL_TABLET | ORAL | 1 refills | Status: DC
Start: 2022-02-22 — End: 2022-08-24

## 2022-02-22 MED ORDER — CARBINOXAMINE MALEATE 4 MG/5ML PO SOLN
ORAL | 3 refills | Status: DC
Start: 2022-02-22 — End: 2022-03-01

## 2022-02-22 MED ORDER — ALVESCO 160 MCG/ACT IN AERS: INHALATION_SPRAY | RESPIRATORY_TRACT | 5 refills | Status: DC

## 2022-02-22 NOTE — Patient Instructions (Addendum)
Plan- try stopping nighttime doses of atrovent and alvesco. If symptoms worsen, return to twice daily dosing.  Seasonal and perennial allergic rhinitis Continue allergy injections per protocol and bring epipen to your appointments.  Continue Carbinaxomine '4mg'$ /17m -take 128mtwice a day as needed.  Continue Atrovent nasal spray 2 sprays each nostril daily (can be used up to 3-4 times a day as needed for nasal drainage control).  If nasal passage is getting too dry then decrease and/or stop use Continue your nasal rinse regimen with mometasone and mupirocin as directed by Dr. TeBenjamine MolaCough persistent and Wheeze Have access to albuterol inhaler 2 puffs every 4-6 hours as needed for cough/wheeze/shortness of breath/chest tightness.  May use 15-20 minutes prior to activity.   Monitor frequency of use.   Continue Alvesco inhaler 1 puffs daily. Return to twice daily dosing if symptoms worsen.  Follow-up in 6 months or sooner if needed. It was a pleasure meeting you today!  ErSigurd SosMD [facility]

## 2022-02-26 ENCOUNTER — Ambulatory Visit (INDEPENDENT_AMBULATORY_CARE_PROVIDER_SITE_OTHER): Payer: Commercial Managed Care - PPO

## 2022-02-26 DIAGNOSIS — J309 Allergic rhinitis, unspecified: Secondary | ICD-10-CM

## 2022-02-27 ENCOUNTER — Other Ambulatory Visit: Payer: Self-pay | Admitting: Internal Medicine

## 2022-03-05 ENCOUNTER — Ambulatory Visit (INDEPENDENT_AMBULATORY_CARE_PROVIDER_SITE_OTHER): Payer: Commercial Managed Care - PPO

## 2022-03-05 DIAGNOSIS — J309 Allergic rhinitis, unspecified: Secondary | ICD-10-CM

## 2022-03-22 ENCOUNTER — Ambulatory Visit (INDEPENDENT_AMBULATORY_CARE_PROVIDER_SITE_OTHER): Payer: Commercial Managed Care - PPO

## 2022-03-22 DIAGNOSIS — J309 Allergic rhinitis, unspecified: Secondary | ICD-10-CM | POA: Diagnosis not present

## 2022-04-02 ENCOUNTER — Ambulatory Visit (INDEPENDENT_AMBULATORY_CARE_PROVIDER_SITE_OTHER): Payer: Commercial Managed Care - PPO

## 2022-04-02 DIAGNOSIS — J309 Allergic rhinitis, unspecified: Secondary | ICD-10-CM

## 2022-04-16 ENCOUNTER — Ambulatory Visit (INDEPENDENT_AMBULATORY_CARE_PROVIDER_SITE_OTHER): Payer: Commercial Managed Care - PPO | Admitting: Family Medicine

## 2022-04-16 ENCOUNTER — Encounter: Payer: Self-pay | Admitting: Family Medicine

## 2022-04-16 VITALS — BP 130/76 | HR 64 | Temp 97.5°F | Ht 76.0 in | Wt 263.8 lb

## 2022-04-16 DIAGNOSIS — Z0000 Encounter for general adult medical examination without abnormal findings: Secondary | ICD-10-CM | POA: Diagnosis not present

## 2022-04-16 DIAGNOSIS — T887XXA Unspecified adverse effect of drug or medicament, initial encounter: Secondary | ICD-10-CM

## 2022-04-16 DIAGNOSIS — E291 Testicular hypofunction: Secondary | ICD-10-CM | POA: Diagnosis not present

## 2022-04-16 DIAGNOSIS — Z23 Encounter for immunization: Secondary | ICD-10-CM

## 2022-04-16 NOTE — Progress Notes (Signed)
Established Patient Office Visit  Subjective   Patient ID: Seth Lowery, male    DOB: 1970/11/16  Age: 51 y.o. MRN: [id number]  Chief Complaint  Patient presents with   Annual Exam    CPE, no concerns. Patient had protein shake for breakfast this morning around 8.     HPI here for physical exam and monitoring for androgen deficiency.  Continues with testosterone cypionate injections every other.  Having no issues doing this.  Energy levels are higher with this medication.  Has been donating blood periodically.  Has regular dental care.  Has gained weight.  He is working on balance and has made improvements there.  Hopes to have more time for exercise.  Urine flow has been excellent.  No problems with bowel function.    Review of Systems  Constitutional: Negative.   HENT: Negative.    Eyes:  Negative for blurred vision, discharge and redness.  Respiratory: Negative.    Cardiovascular: Negative.   Gastrointestinal:  Negative for abdominal pain, blood in stool, constipation and melena.  Genitourinary: Negative.  Negative for dysuria, frequency and urgency.  Musculoskeletal: Negative.  Negative for myalgias.  Skin:  Negative for rash.  Neurological:  Negative for tingling, loss of consciousness and weakness.  Endo/Heme/Allergies:  Negative for polydipsia.      04/16/2022    3:16 PM 07/13/2021   10:01 AM 04/12/2021   10:23 AM  Depression screen PHQ 2/9  Decreased Interest 0 0 0  Down, Depressed, Hopeless 0 0 0  PHQ - 2 Score 0 0 0  Altered sleeping   0  Tired, decreased energy   1  Change in appetite   1  Feeling bad or failure about yourself    0  Trouble concentrating   0  Moving slowly or fidgety/restless   0  Suicidal thoughts   0  PHQ-9 Score   2  Difficult doing work/chores   Not difficult at all       Objective:     BP 130/76 (BP Location: Right Arm, Patient Position: Sitting, Cuff Size: Large)   Pulse 64   Temp (!) 97.5 F (36.4 C) (Temporal)   Ht '6\' 4"'$   (1.93 m)   Wt 263 lb 12.8 oz (119.7 kg)   SpO2 94%   BMI 32.11 kg/m  Wt Readings from Last 3 Encounters:  04/16/22 263 lb 12.8 oz (119.7 kg)  02/22/22 256 lb 14.4 oz (116.5 kg)  08/23/21 234 lb 6.4 oz (106.3 kg)      Physical Exam Constitutional:      General: He is not in acute distress.    Appearance: Normal appearance. He is not ill-appearing, toxic-appearing or diaphoretic.  HENT:     Head: Normocephalic and atraumatic.     Right Ear: Tympanic membrane, ear canal and external ear normal.     Left Ear: Tympanic membrane, ear canal and external ear normal.     Mouth/Throat:     Mouth: Mucous membranes are moist.     Pharynx: Oropharynx is clear. No oropharyngeal exudate or posterior oropharyngeal erythema.  Eyes:     General: No scleral icterus.       Right eye: No discharge.        Left eye: No discharge.     Extraocular Movements: Extraocular movements intact.     Conjunctiva/sclera: Conjunctivae normal.     Pupils: Pupils are equal, round, and reactive to light.  Cardiovascular:     Rate and Rhythm: Normal rate and  regular rhythm.  Pulmonary:     Effort: Pulmonary effort is normal. No respiratory distress.     Breath sounds: Normal breath sounds.  Abdominal:     General: Bowel sounds are normal. There is no distension.     Tenderness: There is no abdominal tenderness. There is no guarding.  Musculoskeletal:     Cervical back: No rigidity or tenderness.  Skin:    General: Skin is warm and dry.  Neurological:     Mental Status: He is alert and oriented to person, place, and time.  Psychiatric:        Mood and Affect: Mood normal.        Behavior: Behavior normal.      No results found for any visits on 04/16/22.    The 10-year ASCVD risk score (Arnett DK, et al., 2019) is: 3%    Assessment & Plan:   Problem List Items Addressed This Visit       Endocrine   Androgen deficiency     Other   Healthcare maintenance - Primary   Relevant Orders   CBC    Comprehensive metabolic panel   Lipid panel   PSA   Urinalysis, Routine w reflex microscopic   Medication side effect   Relevant Orders   CBC   Need for shingles vaccine   Relevant Orders   Varicella-zoster vaccine IM    Return in about 1 year (around 04/17/2023), or if symptoms worsen or fail to improve.   Information was given on health maintenance and preventative care.  He is up-to-date care.  Encouraged exercise at least 30 minutes 5 days weekly.  Walking would be great.  Advised weight loss.  Information was given on the Shingrix vaccine.  We will follow-up in 2 to 6 months for his second vaccine. [name], MD

## 2022-04-17 LAB — COMPREHENSIVE METABOLIC PANEL
ALT: 45 U/L (ref 0–53)
AST: 34 U/L (ref 0–37)
Albumin: 4.6 g/dL (ref 3.5–5.2)
Alkaline Phosphatase: 64 U/L (ref 39–117)
BUN: 19 mg/dL (ref 6–23)
CO2: 27 mEq/L (ref 19–32)
Calcium: 8.9 mg/dL (ref 8.4–10.5)
Chloride: 103 mEq/L (ref 96–112)
Creatinine, Ser: 1.02 mg/dL (ref 0.40–1.50)
GFR: 85.55 mL/min (ref >60.00)
Glucose, Bld: 76 mg/dL (ref 70–99)
Potassium: 4 mEq/L (ref 3.5–5.1)
Sodium: 139 mEq/L (ref 135–145)
Total Bilirubin: 0.8 mg/dL (ref 0.2–1.2)
Total Protein: 7.2 g/dL (ref 6.0–8.3)

## 2022-04-17 LAB — LIPID PANEL
Cholesterol: 192 mg/dL (ref 0–200)
HDL: 49.6 mg/dL (ref >39.00)
LDL Cholesterol: 127 mg/dL — ABNORMAL HIGH (ref 0–99)
NonHDL: 142.02
Total CHOL/HDL Ratio: 4
Triglycerides: 76 mg/dL (ref 0.0–149.0)
VLDL: 15.2 mg/dL (ref 0.0–40.0)

## 2022-04-17 LAB — URINALYSIS, ROUTINE W REFLEX MICROSCOPIC
Bilirubin Urine: NEGATIVE
Hgb urine dipstick: NEGATIVE
Ketones, ur: NEGATIVE
Leukocytes,Ua: NEGATIVE
Nitrite: NEGATIVE
Specific Gravity, Urine: 1.01 (ref 1.000–1.030)
Total Protein, Urine: NEGATIVE
Urine Glucose: NEGATIVE
Urobilinogen, UA: 0.2 (ref 0.0–1.0)
pH: 7 (ref 5.0–8.0)

## 2022-04-17 LAB — CBC
HCT: 50.6 % (ref 39.0–52.0)
Hemoglobin: 17 g/dL (ref 13.0–17.0)
MCHC: 33.7 g/dL (ref 30.0–36.0)
MCV: 84.2 fl (ref 78.0–100.0)
Platelets: 237 10*3/uL (ref 150.0–400.0)
RBC: 6 Mil/uL — ABNORMAL HIGH (ref 4.22–5.81)
RDW: 15 % (ref 11.5–15.5)
WBC: 9.3 10*3/uL (ref 4.0–10.5)

## 2022-04-17 LAB — PSA: PSA: 0.51 ng/mL (ref 0.10–4.00)

## 2022-04-18 ENCOUNTER — Ambulatory Visit (INDEPENDENT_AMBULATORY_CARE_PROVIDER_SITE_OTHER): Payer: Commercial Managed Care - PPO

## 2022-04-18 DIAGNOSIS — J309 Allergic rhinitis, unspecified: Secondary | ICD-10-CM | POA: Diagnosis not present

## 2022-05-01 ENCOUNTER — Ambulatory Visit (INDEPENDENT_AMBULATORY_CARE_PROVIDER_SITE_OTHER): Payer: Commercial Managed Care - PPO

## 2022-05-01 DIAGNOSIS — J309 Allergic rhinitis, unspecified: Secondary | ICD-10-CM

## 2022-05-03 NOTE — Progress Notes (Signed)
VIALS EXP 05-04-23

## 2022-05-04 DIAGNOSIS — J3089 Other allergic rhinitis: Secondary | ICD-10-CM | POA: Diagnosis not present

## 2022-05-14 ENCOUNTER — Ambulatory Visit (INDEPENDENT_AMBULATORY_CARE_PROVIDER_SITE_OTHER): Payer: Commercial Managed Care - PPO

## 2022-05-14 DIAGNOSIS — J309 Allergic rhinitis, unspecified: Secondary | ICD-10-CM | POA: Diagnosis not present

## 2022-05-28 ENCOUNTER — Ambulatory Visit (INDEPENDENT_AMBULATORY_CARE_PROVIDER_SITE_OTHER): Payer: Commercial Managed Care - PPO

## 2022-05-28 DIAGNOSIS — J309 Allergic rhinitis, unspecified: Secondary | ICD-10-CM

## 2022-06-04 ENCOUNTER — Ambulatory Visit (INDEPENDENT_AMBULATORY_CARE_PROVIDER_SITE_OTHER): Payer: Commercial Managed Care - PPO

## 2022-06-04 DIAGNOSIS — J309 Allergic rhinitis, unspecified: Secondary | ICD-10-CM

## 2022-06-11 ENCOUNTER — Ambulatory Visit (INDEPENDENT_AMBULATORY_CARE_PROVIDER_SITE_OTHER): Payer: Commercial Managed Care - PPO

## 2022-06-11 DIAGNOSIS — J309 Allergic rhinitis, unspecified: Secondary | ICD-10-CM

## 2022-06-18 ENCOUNTER — Ambulatory Visit (INDEPENDENT_AMBULATORY_CARE_PROVIDER_SITE_OTHER): Payer: Commercial Managed Care - PPO

## 2022-06-18 DIAGNOSIS — J309 Allergic rhinitis, unspecified: Secondary | ICD-10-CM

## 2022-06-19 ENCOUNTER — Ambulatory Visit (INDEPENDENT_AMBULATORY_CARE_PROVIDER_SITE_OTHER): Payer: Commercial Managed Care - PPO

## 2022-06-19 DIAGNOSIS — Z23 Encounter for immunization: Secondary | ICD-10-CM

## 2022-06-19 NOTE — Progress Notes (Signed)
Per orders of Dr. Ethelene Hal  pt is here for 2nd dose of Shingles vaccine. Pt received 2nd dose shingles vaccine in left deltoid at 3:45 pm. Given by [place] L. CMA/CPT, Pt tolerated 2nd dose shingles vaccine well. Dose 2/2. Series completed.

## 2022-06-28 ENCOUNTER — Ambulatory Visit (INDEPENDENT_AMBULATORY_CARE_PROVIDER_SITE_OTHER): Payer: Commercial Managed Care - PPO

## 2022-06-28 DIAGNOSIS — J309 Allergic rhinitis, unspecified: Secondary | ICD-10-CM | POA: Diagnosis not present

## 2022-07-13 ENCOUNTER — Ambulatory Visit (INDEPENDENT_AMBULATORY_CARE_PROVIDER_SITE_OTHER): Payer: Commercial Managed Care - PPO

## 2022-07-13 DIAGNOSIS — J309 Allergic rhinitis, unspecified: Secondary | ICD-10-CM

## 2022-07-23 ENCOUNTER — Ambulatory Visit (INDEPENDENT_AMBULATORY_CARE_PROVIDER_SITE_OTHER): Payer: Commercial Managed Care - PPO

## 2022-07-23 DIAGNOSIS — J309 Allergic rhinitis, unspecified: Secondary | ICD-10-CM

## 2022-07-28 ENCOUNTER — Other Ambulatory Visit: Payer: Self-pay | Admitting: Internal Medicine

## 2022-08-07 ENCOUNTER — Ambulatory Visit (INDEPENDENT_AMBULATORY_CARE_PROVIDER_SITE_OTHER): Payer: Commercial Managed Care - PPO

## 2022-08-07 DIAGNOSIS — J309 Allergic rhinitis, unspecified: Secondary | ICD-10-CM

## 2022-08-20 ENCOUNTER — Ambulatory Visit (INDEPENDENT_AMBULATORY_CARE_PROVIDER_SITE_OTHER): Payer: Commercial Managed Care - PPO

## 2022-08-20 DIAGNOSIS — J309 Allergic rhinitis, unspecified: Secondary | ICD-10-CM

## 2022-08-23 ENCOUNTER — Other Ambulatory Visit: Payer: Self-pay | Admitting: Internal Medicine

## 2022-08-23 ENCOUNTER — Other Ambulatory Visit: Payer: Self-pay | Admitting: Family Medicine

## 2022-08-23 DIAGNOSIS — E291 Testicular hypofunction: Secondary | ICD-10-CM

## 2022-08-23 NOTE — Progress Notes (Signed)
FOLLOW UP Date of Service/Encounter:  08/24/22   Subjective:  Seth Lowery (DOB: April 10, 1971) is a 51 y.o. male who returns to the [facility] on 08/24/2022 in re-evaluation of the following: allergic rhinitis and persistent cough/wheeze  History obtained from: chart review and patient.  For Review, LV was on 02/22/22  with Dr.Mackenze Grandison seen for routine follow-up. His coughing wheezing was controlled on Alvesco 1 puff twice daily.  Continue to have some PND, using nasal sinus rinses and Atrovent prescribed by Dr. [name].  FEV1 was 69%.  We discussed trying to stop his nighttime dose of Atrovent and Alvesco.  Pertinent history and diagnostics:  Started AIT 05/30/20. Currently at maintenance dose coming q3 weeks. 2 vials-weeds trees #1, grass, ragweed, dust mites, dog #2 2018 Chest CT: Fatty infiltration of the liver. No significant abnormality is seen in the chest. 2022 CT sinus: 1. Moderate to severe mucosal thickening throughout the paranasal sinuses with fluid levels in the maxillary sinuses potentially indicating acute on chronic sinusitis. 2. Small left mastoid effusion. Previous spirometrys all showing possible restriction, no obstruction.  Clinically improved with ICS. --------------------------------------------------- Today presents for follow-up. He is still dependent on his allergy medications. He does Alvesco inhaler one puff morning and night. He is taking atrovent 2 sprays morning and night. He takes carbinoxamine 10 ml twice daily. Cough and drainage are controlled on this, but if he doesn't take, he will start having significant cough with drainage. Usually between 10 AM and 1 PM. He is not taking anything for reflux. He eats a varied breakfast.  He denies any heartburn symptoms. He does not notice a specific trigger.   He continues to follow-up with Dr. [name], but suspects this will be his last visit as he will no longer be accepting his insurance.  He has done  well following the surgery and was told to follow-up with ENT only if symptoms worsen.  He uses a nasal sprays as prescribed by him, but has to do them infrequently. He continues on allergy injections which she is tolerating well, now spaced out to every 3 weeks.   Allergies as of 08/24/2022   No Known Allergies      Medication List        Accurate as of August 24, 2022  1:16 PM. If you have any questions, ask your nurse or doctor.          STOP taking these medications    Alvesco 160 MCG/ACT inhaler Generic drug: ciclesonide Stopped by: Clemon Chambers, MD   levocetirizine 5 MG tablet Commonly known as: XYZAL Stopped by: Clemon Chambers, MD       TAKE these medications    albuterol 108 (90 Base) MCG/ACT inhaler Commonly known as: VENTOLIN HFA Inhale 1-2 puffs into the lungs every 6 (six) hours as needed for wheezing or shortness of breath.   Dulera 100-5 MCG/ACT Aero Generic drug: mometasone-formoterol Inhale 2 puffs into the lungs 2 (two) times daily. Started by: Clemon Chambers, MD   EPINEPHrine 0.3 mg/0.3 mL Soaj injection Commonly known as: EpiPen 2-Pak Inject 0.3 mg into the muscle once as needed for up to 1 dose for anaphylaxis.   ipratropium 0.06 % nasal spray Commonly known as: ATROVENT Place 2 sprays into both nostrils 2 (two) times daily.   Cristino Martes ER 4 MG/5ML Suer Generic drug: Carbinoxamine Maleate ER TAKE 10 MLS BY MOUTH TWICE A DAY AS NEEDED   Mupirocin Powd Place 20 mg into the nose in  the morning and at bedtime. MIX WITH MOMETASONE 0.'6MG'$    omeprazole 40 MG capsule Commonly known as: PRILOSEC Take one daily before breakfast Started by: Clemon Chambers, MD   testosterone cypionate 200 MG/ML injection Commonly known as: DEPOTESTOSTERONE CYPIONATE INJECT 1 ML (200 MG TOTAL) INTO THE MUSCLE EVERY 14 DAYS       Past Medical History:  Diagnosis Date   Allergy    Chronic cough    from sinusitis   Hyperlipidemia    no meds   Recurrent  sinusitis    Past Surgical History:  Procedure Laterality Date   ADENOIDECTOMY     ETHMOIDECTOMY Bilateral 11/07/2020   Procedure: ENDOSCOPIC ETHMOIDECTOMY;  Surgeon: Leta Baptist, MD;  Location: [place];  Service: ENT;  Laterality: Bilateral;   FRONTAL SINUS EXPLORATION Bilateral 11/07/2020   Procedure: ENDOSCOPIC FRONTAL RECESS SINUS EXPLORATION;  Surgeon: Leta Baptist, MD;  Location: [place];  Service: ENT;  Laterality: Bilateral;   MAXILLARY ANTROSTOMY Bilateral 11/07/2020   Procedure: ENDOSCOPIC MAXILLARY ANTROSTOMY WITH TISSUE REMOVAL;  Surgeon: Leta Baptist, MD;  Location: [place];  Service: ENT;  Laterality: Bilateral;   NASAL SINUS SURGERY  08/05/2018   SINUS ENDO WITH FUSION Bilateral 11/07/2020   Procedure: SINUS ENDOSCOPY WITH FUSION NAVIGATION;  Surgeon: Leta Baptist, MD;  Location: [place];  Service: ENT;  Laterality: Bilateral;   SINUS SURGERY WITH INSTATRAK     SINUS SURGERY WITH INSTATRAK     SPHENOIDECTOMY Bilateral 11/07/2020   Procedure: ENDOSCOPIC SPHENOIDECTOMY WITH TISSUE REMOVAL;  Surgeon: Leta Baptist, MD;  Location: [place];  Service: ENT;  Laterality: Bilateral;   TONSILLECTOMY     Otherwise, there have been no changes to his past medical history, surgical history, family history, or social history.  ROS: All others negative except as noted per HPI.   Objective:  BP 110/76 (BP Location: Right Arm, Patient Position: Sitting, Cuff Size: Large)   Pulse 72   Temp 98 F (36.7 C) (Temporal)   Resp 16   Ht '6\' 5"'$  (1.956 m)   Wt 264 lb (119.7 kg)   SpO2 96%   BMI 31.31 kg/m  Body mass index is 31.31 kg/m. Physical Exam: General Appearance:  Alert, cooperative, no distress, appears stated age  Head:  Normocephalic, without obvious abnormality, atraumatic  Eyes:  Conjunctiva clear, EOM's intact  Nose: Nares normal, hypertrophic turbinates, normal mucosa, and no visible anterior polyps  Throat:  Lips, tongue normal; teeth and gums normal, normal posterior oropharynx and + cobblestoning  Neck: Supple, symmetrical  Lungs:   clear to auscultation bilaterally, Respirations unlabored,  intermittent productive sounding cough  Heart:  regular rate and rhythm and no murmur, Appears well perfused  Extremities: No edema  Skin: Skin color, texture, turgor normal, no rashes or lesions on visualized portions of skin  Neurologic: No gross deficits    Spirometry:  Tracings reviewed. His effort: It was hard to get consistent efforts and there is a question as to whether this reflects a maximal maneuver. Patient coughing FVC: 4.60L pre, 4.68L post FEV1: 3.18L, 68% predicted (pre), 3.53L, 78% (post) FEV1/FVC ratio: 0.69 pre, 0.75 post Interpretation: Spirometry consistent with mixed obstructive and restrictive disease with only partial bronchodilator response.  Please see scanned spirometry results for details.   Assessment/Plan   Seasonal and perennial allergic rhinitis-not controlled Continue allergy injections per protocol and bring epipen to your appointments.  Continue Carbinaxomine '4mg'$ /51m -take 176mtwice a day as needed.  Continue Atrovent nasal spray 2 sprays each nostril daily (can be used up to 3-4 times a day as needed for nasal drainage control).  If nasal passage is getting too dry then decrease and/or stop use Continue your nasal rinse regimen with mometasone and mupirocin as directed by Dr. Vanessa Barbara can take over these prescriptions if we need to  Persistent cough and wheeze:  Breathing test today shows obstructions with some improvement following albuterol-suspect asthma uncontrolled Have access to albuterol inhaler 2 puffs every 4-6 hours as needed for cough/wheeze/shortness of breath/chest tightness.  May use 15-20 minutes prior to activity.   Monitor frequency of use.   Lets try Dulera 100 mcg 2 puffs twice a day. Use this until your follow-up. This will replace alvesco. Start  omeprazole 40 mg and take 30 minutes prior to breakfast daily. Continue until next appointment.  Follow-up in 4- 6 weeks or sooner for updated allergy testing and follow-up reflux. It was a pleasure seeing you again today!   Sigurd Sos, MD  [facility]

## 2022-08-24 ENCOUNTER — Ambulatory Visit: Payer: Commercial Managed Care - PPO | Admitting: Internal Medicine

## 2022-08-24 ENCOUNTER — Encounter: Payer: Self-pay | Admitting: Internal Medicine

## 2022-08-24 VITALS — BP 110/76 | HR 72 | Temp 98.0°F | Resp 16 | Ht 77.0 in | Wt 264.0 lb

## 2022-08-24 DIAGNOSIS — J3089 Other allergic rhinitis: Secondary | ICD-10-CM

## 2022-08-24 DIAGNOSIS — J4530 Mild persistent asthma, uncomplicated: Secondary | ICD-10-CM | POA: Diagnosis not present

## 2022-08-24 DIAGNOSIS — R062 Wheezing: Secondary | ICD-10-CM | POA: Diagnosis not present

## 2022-08-24 DIAGNOSIS — R053 Chronic cough: Secondary | ICD-10-CM

## 2022-08-24 MED ORDER — OMEPRAZOLE 40 MG PO CPDR: DELAYED_RELEASE_CAPSULE | ORAL | 2 refills | Status: DC

## 2022-08-24 MED ORDER — DULERA 100-5 MCG/ACT IN AERO: 2.0000 | INHALATION_SPRAY | Freq: Two times a day (BID) | RESPIRATORY_TRACT | 5 refills | Status: DC

## 2022-08-24 MED ORDER — OMEPRAZOLE 40 MG PO CPDR: DELAYED_RELEASE_CAPSULE | ORAL | 5 refills | Status: DC

## 2022-08-24 NOTE — Patient Instructions (Addendum)
Seasonal and perennial allergic rhinitis Continue allergy injections per protocol and bring epipen to your appointments.  Continue Carbinaxomine '4mg'$ /70m -take 125mtwice a day as needed.  Continue Atrovent nasal spray 2 sprays each nostril daily (can be used up to 3-4 times a day as needed for nasal drainage control).  If nasal passage is getting too dry then decrease and/or stop use Continue your nasal rinse regimen with mometasone and mupirocin as directed by Dr. TeVanessa Barbaraan take over these prescriptions if we need to  Cough persistent and Wheeze Breathing test today shows obstructions with some improvement following albuterol Have access to albuterol inhaler 2 puffs every 4-6 hours as needed for cough/wheeze/shortness of breath/chest tightness.  May use 15-20 minutes prior to activity.   Monitor frequency of use.   Lets try Dulera 100 mcg 2 puffs twice a day. Use this until your follow-up. This will replace alvesco. Start omeprazole 40 mg and take 30 minutes prior to breakfast daily. Continue until next appointment.  Follow-up in 4- 6 weeks or sooner for updated allergy testing and follow-up reflux. It was a pleasure seeing you again today!  ErSigurd SosMD [facility]

## 2022-08-28 NOTE — Progress Notes (Signed)
VIALS EXP 08-29-23 

## 2022-08-29 DIAGNOSIS — J3089 Other allergic rhinitis: Secondary | ICD-10-CM | POA: Diagnosis not present

## 2022-09-14 ENCOUNTER — Ambulatory Visit (INDEPENDENT_AMBULATORY_CARE_PROVIDER_SITE_OTHER): Payer: Commercial Managed Care - PPO

## 2022-09-14 DIAGNOSIS — J309 Allergic rhinitis, unspecified: Secondary | ICD-10-CM | POA: Diagnosis not present

## 2022-09-14 DIAGNOSIS — J4530 Mild persistent asthma, uncomplicated: Secondary | ICD-10-CM

## 2022-09-30 IMAGING — CT CT MAXILLOFACIAL W/O CM
1 series · 15 of 30 positions shown, 19 images · non-contrast
Comparison: None.

CLINICAL DATA: Chronic sinusitis.  Prior sinus surgeries.

EXAM:
CT MAXILLOFACIAL WITHOUT CONTRAST
TECHNIQUE: Multidetector CT images of the paranasal sinuses were obtained using
the standard protocol without intravenous contrast.

[Series 4: soft tissue · axial · 0.41mm/px · z∈[-144,-20]mm · 15 of 134 slices shown, 19 images]
[im 5/134  brain]
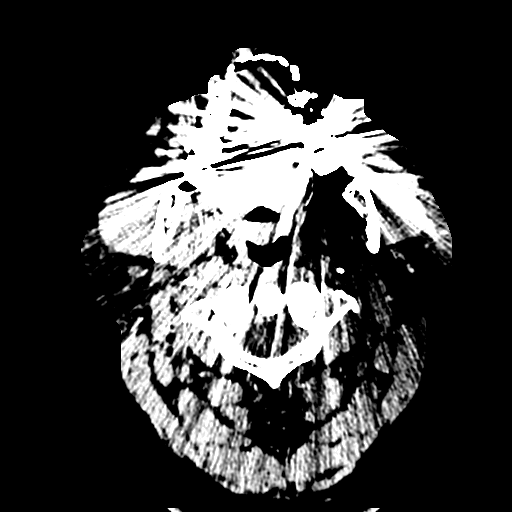
[im 5/134  bone]
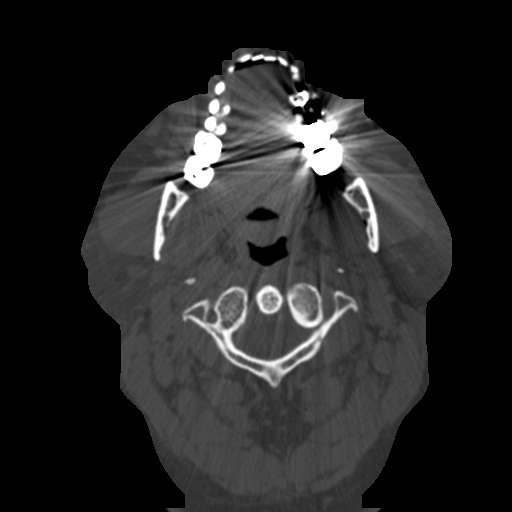
[im 14/134  bone]
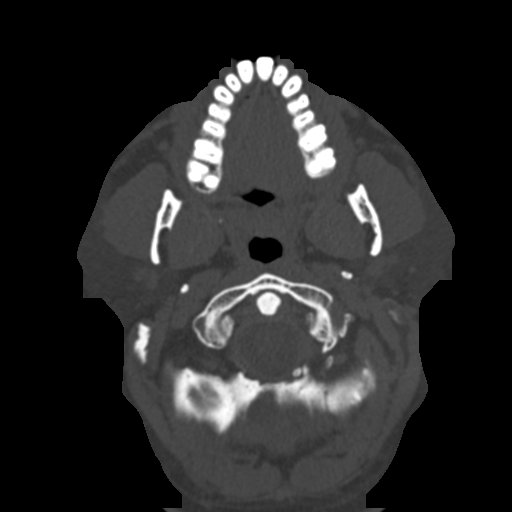
[im 23/134  bone]
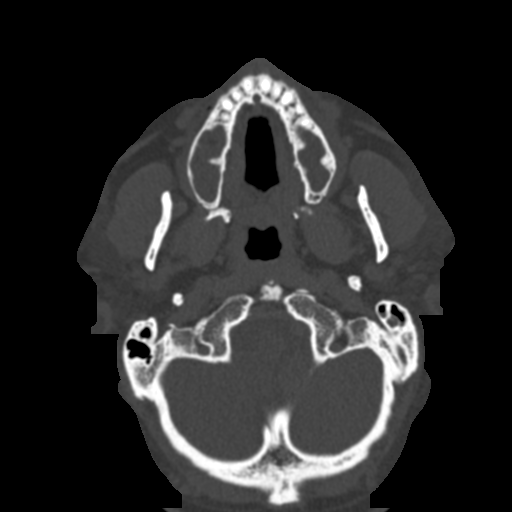
[im 33/134  bone]
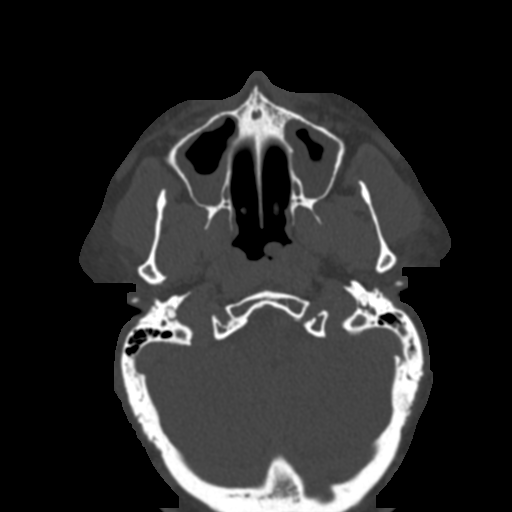
[im 42/134  brain]
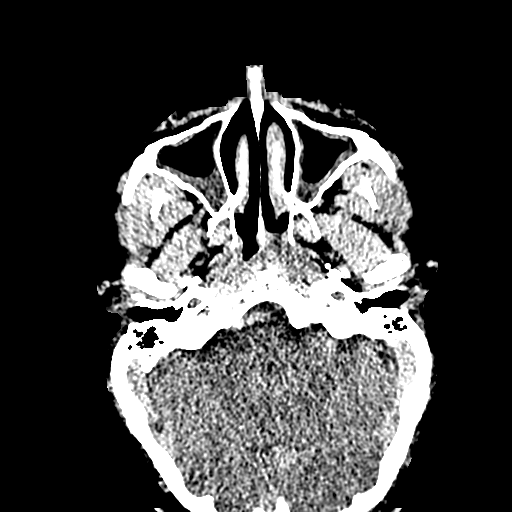
[im 42/134  bone]
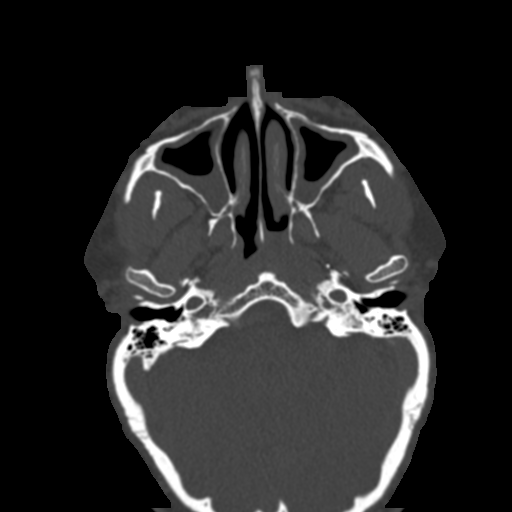
[im 51/134  bone]
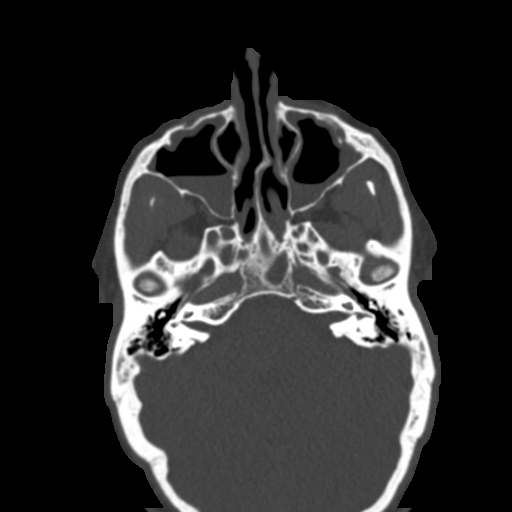
[im 60/134  bone]
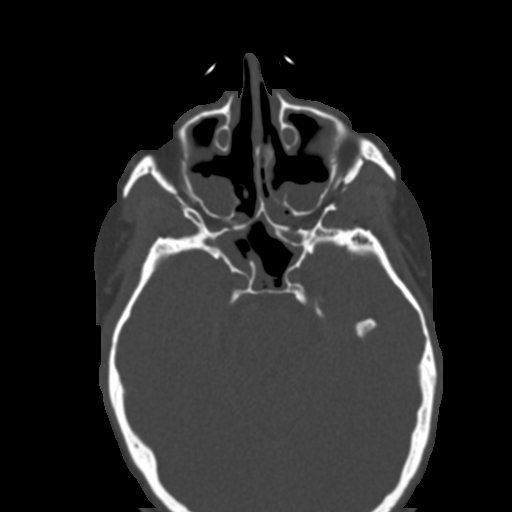
[im 69/134  bone]
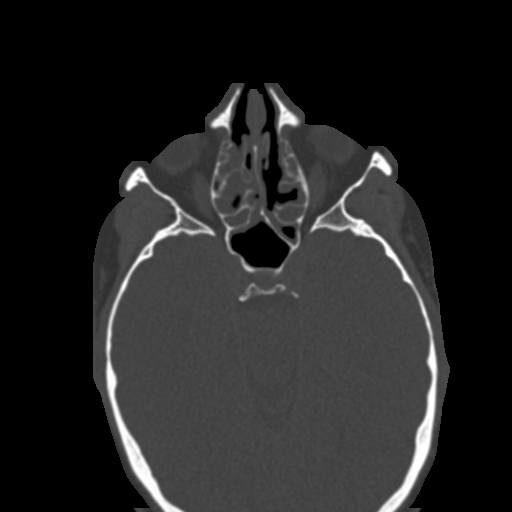
[im 74/134  brain]
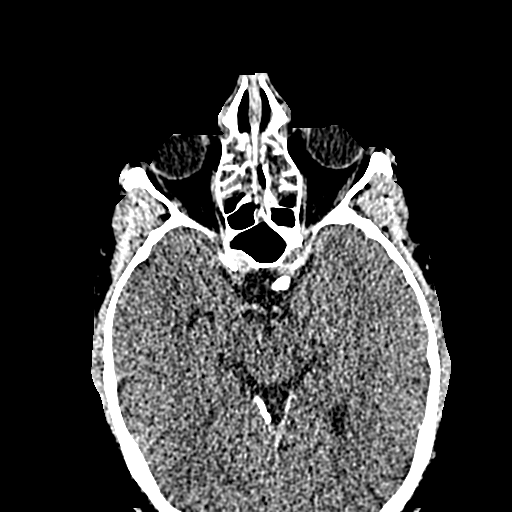
[im 74/134  bone]
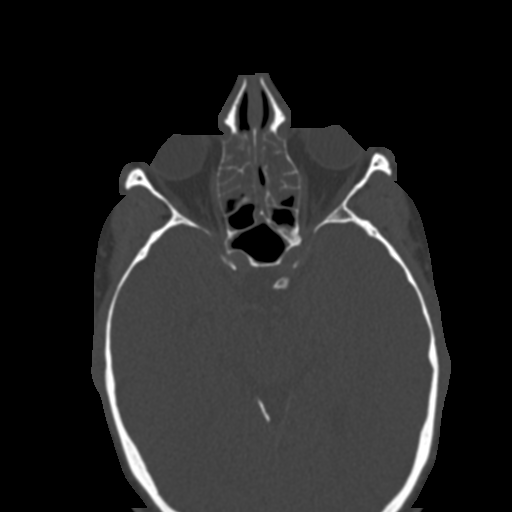
[im 83/134  bone]
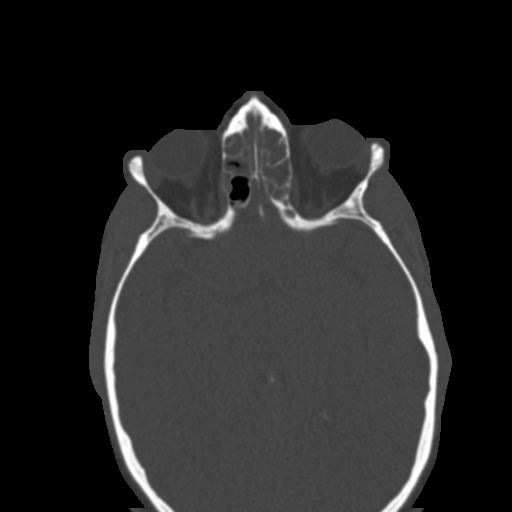
[im 92/134  bone]
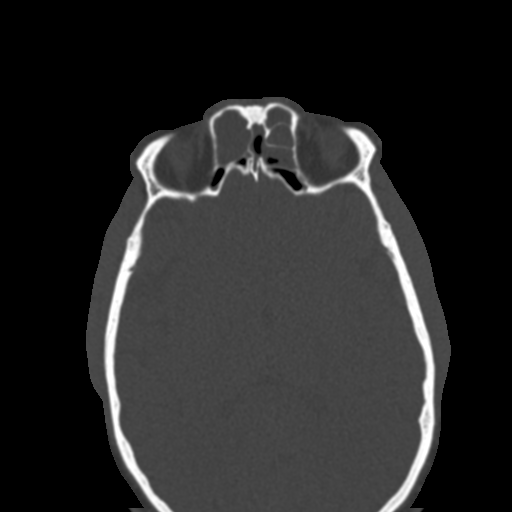
[im 101/134  bone]
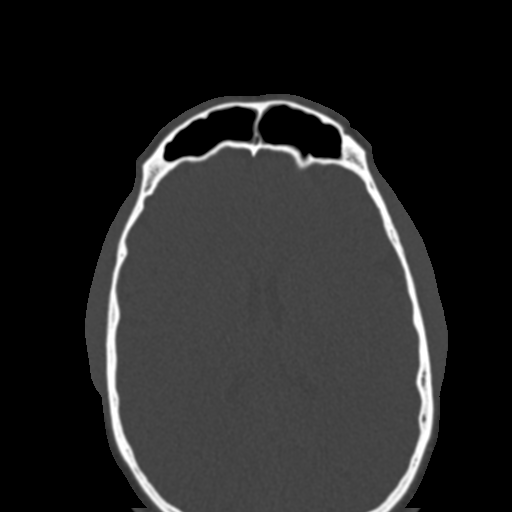
[im 111/134  brain]
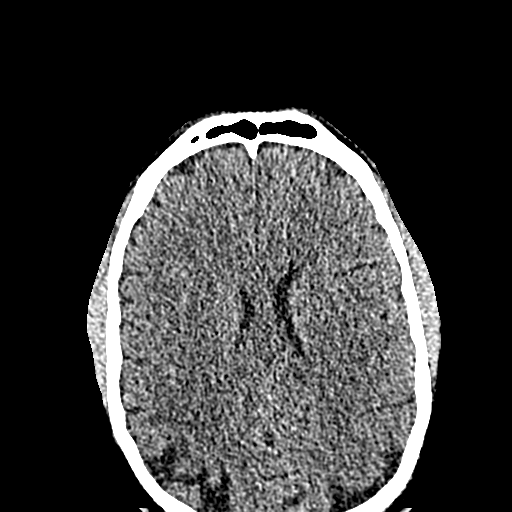
[im 111/134  bone]
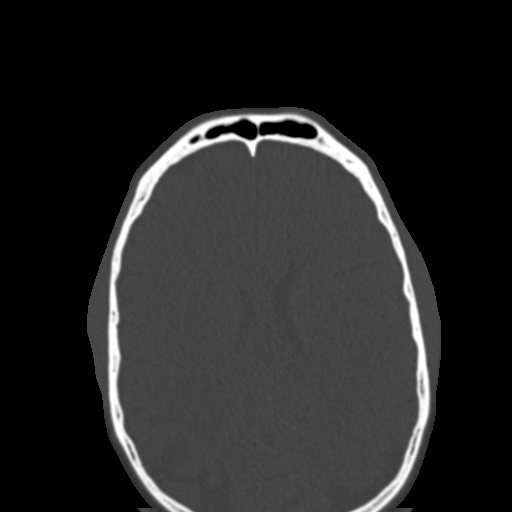
[im 120/134  bone]
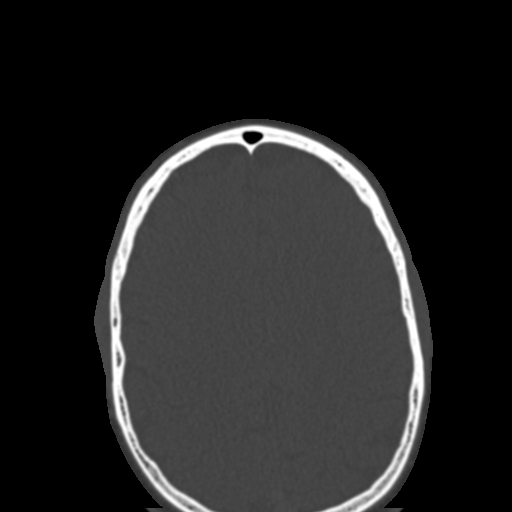
[im 129/134  bone]
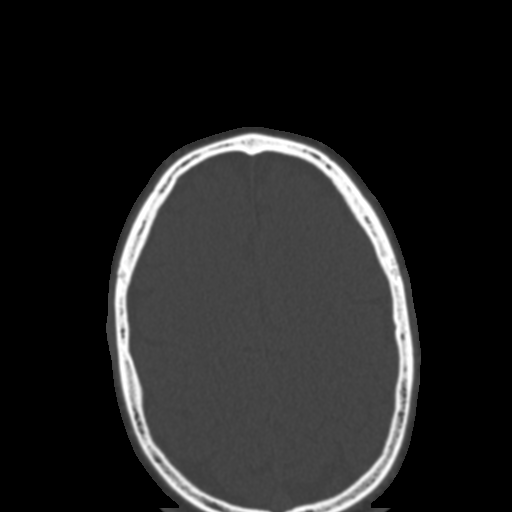

[15 of 30 positions shown; findings below may reference images not displayed]

FINDINGS: Paranasal sinuses:

Frontal: Moderate mucosal thickening inferiorly in both frontal
sinuses with opacification of the frontal recesses.

Ethmoid: Moderately severe ethmoid air cell opacification
bilaterally, greatest anteriorly.

Maxillary: Circumferential mucosal thickening in both maxillary
sinuses with right larger than left fluid levels.

Sphenoid: Moderate mucosal thickening in both sphenoid sinuses with
the right sphenoid sinus being dominant.

Right ostiomeatal unit: Patent maxillary antrostomy.

Left ostiomeatal unit: Patent maxillary antrostomy.

Nasal passages: Previous uncinectomies and partial middle
turbinectomies bilaterally. 3-4 mm of leftward deviation of an
intact nasal septum with mild septal spurring contacting the left
inferior nasal turbinate.

Anatomy: Pneumatization superior to the anterior ethmoid notches
bilaterally. Keros II. Sellar sphenoid pneumatization pattern. No
dehiscence of carotid or optic canals. No onodi cell.

Other: Small left mastoid effusion. Clear tympanic cavities.
Unremarkable appearance of the orbits and included portion of the
brain.
IMPRESSION: 1. Moderate to severe mucosal thickening throughout the paranasal
sinuses with fluid levels in the maxillary sinuses potentially
indicating acute on chronic sinusitis.
2. Small left mastoid effusion.

## 2022-10-01 ENCOUNTER — Ambulatory Visit: Payer: Commercial Managed Care - PPO | Admitting: Internal Medicine

## 2022-10-02 NOTE — Progress Notes (Signed)
Date of Service/Encounter:  10/04/22  Allergy testing appointment   Last visit on 08/24/22, seen for uncontrolled asthma, allergic rhinitis and conjunctivitis.  Please see that note for additional details.  Pertinent history and diagnostics:  Started AIT 05/30/20. Currently at maintenance dose coming q3 weeks. 2 vials-weeds trees #1, grass, ragweed, dust mites, dog #2 2018 Chest CT: Fatty infiltration of the liver. No significant abnormality is seen in the chest. 2022 CT sinus: 1. Moderate to severe mucosal thickening throughout the paranasal sinuses with fluid levels in the maxillary sinuses potentially indicating acute on chronic sinusitis. 2. Small left mastoid effusion. Previous spirometrys all showing possible restriction, no obstruction.  Clinically improved with ICS.  Today reports for allergy diagnostic testing:    DIAGNOSTICS:  Skin Testing: Environmental allergy panel and select foods. Adequate positive and negative controls Results discussed with patient/family.  Airborne Adult Perc - 10/04/22 0938     Time Antigen Placed 7048    Allergen Manufacturer Lavella Hammock    Location Back    Number of Test 58    Panel 1 Select    1. Control-Buffer 50% Glycerol Negative    2. Control-Histamine 1 mg/ml 3+    3. Albumin saline Negative    4. Chula Negative    5. [place] Negative    6. Johnson Negative    7. Makanda Blue Negative    9. Perennial Rye Negative    10. Sweet Vernal Negative    11. Timothy Negative    12. Cocklebur Negative    13. Burweed Marshelder Negative    14. Ragweed, short Negative    15. Ragweed, Giant Negative    16. Plantain,  English Negative    17. Lamb's Quarters Negative    18. Sheep Sorrell Negative    19. Rough Pigweed Negative    20. Marsh Elder, Rough Negative    21. Mugwort, Common Negative    22. Ash mix Negative    23. Birch mix Negative    24. Beech American Negative    25. Box, Elder Negative    26. Cedar, red Negative    27.  Cottonwood, [place] Negative    28. Elm mix Negative    29. Hickory 3+    30. Maple mix Negative    31. Oak, [place] mix Negative    32. Pecan Pollen Negative    33. Pine mix Negative    34. Sycamore Eastern Negative    35. Murphy, Black Pollen Negative    36. Alternaria alternata Negative    37. Cladosporium Herbarum Negative    38. Aspergillus mix Negative    39. Penicillium mix Negative    40. Bipolaris sorokiniana (Helminthosporium) Negative    41. Drechslera spicifera (Curvularia) Negative    42. Mucor plumbeus Negative    43. Fusarium moniliforme Negative    44. Aureobasidium pullulans (pullulara) Negative    45. Rhizopus oryzae Negative    46. Botrytis cinera Negative    47. Epicoccum nigrum Negative    48. Phoma betae Negative    49. Candida Albicans Negative    50. Trichophyton mentagrophytes Negative    51. Mite, D Farinae  5,000 AU/ml Negative    52. Mite, D Pteronyssinus  5,000 AU/ml Negative    53. Cat Hair 10,000 BAU/ml Negative    54.  Dog Epithelia Negative    55. Mixed Feathers Negative    56. Horse Epithelia Negative    57. Cockroach, German Negative    58. Mouse Negative  59. Tobacco Leaf Negative             Food Perc - 10/04/22 0938       Test Information   Time Antigen Placed 6244    Allergen Manufacturer Lavella Hammock    Location Back    Food Select      Food   1. Peanut Negative    2. Soybean food Negative    3. Wheat, whole Negative    4. Sesame Negative    5. Milk, cow Negative    6. Egg White, chicken Negative    7. Casein Negative    8. Shellfish mix Negative    9. Fish mix Negative    10. Cashew Negative             Intradermal - 10/04/22 0938     Time Antigen Placed 6950    Allergen Manufacturer Lavella Hammock    Location Arm    Number of Test 14    Control Negative    Guatem[place] Negative    Johnson Negative    7 Grass 4+    Ragweed mix Negative    Weed mix 4+    Mold 1 4+    Mold 2 4+    Mold 3 2+    Mold 4 4+    Cat  Negative    Dog Negative    Cockroach 4+    Mite mix 2+             Allergy testing results were read and interpreted by myself, documented by clinical staff.  Patient provided with copy of allergy testing along with avoidance measures when indicated.   Sigurd Sos, MD  [facility]

## 2022-10-04 ENCOUNTER — Ambulatory Visit: Payer: Commercial Managed Care - PPO | Admitting: Internal Medicine

## 2022-10-04 ENCOUNTER — Encounter: Payer: Self-pay | Admitting: Internal Medicine

## 2022-10-04 VITALS — BP 132/76 | HR 72 | Temp 97.8°F | Resp 17

## 2022-10-04 DIAGNOSIS — J4530 Mild persistent asthma, uncomplicated: Secondary | ICD-10-CM

## 2022-10-04 DIAGNOSIS — J309 Allergic rhinitis, unspecified: Secondary | ICD-10-CM

## 2022-10-04 DIAGNOSIS — J3089 Other allergic rhinitis: Secondary | ICD-10-CM

## 2022-10-04 MED ORDER — DULERA 200-5 MCG/ACT IN AERO: 2.0000 | INHALATION_SPRAY | Freq: Two times a day (BID) | RESPIRATORY_TRACT | 5 refills | Status: DC

## 2022-10-04 NOTE — Addendum Note (Signed)
Addended by: Clemon Chambers on: 10/04/2022 05:44 PM   Modules accepted: Orders

## 2022-10-04 NOTE — Patient Instructions (Addendum)
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 tree on skin testing, intradermal's positive to mold 1, mold 2, mold 3, mold 4, dust mite, cockroach, weed mix and 7 grass mix  All of these are being covered in your current shots with the exception of Aspergillus which is in the mold mix.  I am going to review your injections to see if we can make any dosing adjustments, but we are not missing any of these allergens in your current shot prescription.   Continue allergy injections per protocol and bring epipen to your appointments.  Continue Carbinaxomine '4mg'$ /24m -take 167mtwice a day as needed.  Continue Atrovent nasal spray 2 sprays each nostril daily (can be used up to 3-4 times a day as needed for nasal drainage control).  If nasal passage is getting too dry then decrease and/or stop use Continue your nasal rinse regimen with mometasone and mupirocin as directed by Dr. TeVanessa Barbaraan take over these prescriptions if we need to Please asked Dr. TeBenjamine Molaf he knows anyone who is doing cryoablation for vasomotor rhinitis  Cough, persistent and Wheeze Have access to albuterol inhaler 2 puffs every 4-6 hours as needed for cough/wheeze/shortness of breath/chest tightness.  May use 15-20 minutes prior to activity.   Monitor frequency of use.   Dulera 200 mcg 2 puffs twice a day. Use this until your follow-up. This will replace alvesco. Start omeprazole 40 mg and take 30 minutes prior to breakfast daily. Continue for 4 to 6 weeks, if no improvement stop  Follow-up in 4 months or sooner if needed It was a pleasure seeing you again today!  ErSigurd SosMD [facility]

## 2022-10-05 NOTE — Progress Notes (Signed)
Spoke to pt, he confirmed and verbalized understanding.

## 2022-10-12 ENCOUNTER — Ambulatory Visit (INDEPENDENT_AMBULATORY_CARE_PROVIDER_SITE_OTHER): Payer: Commercial Managed Care - PPO

## 2022-10-12 DIAGNOSIS — J309 Allergic rhinitis, unspecified: Secondary | ICD-10-CM | POA: Diagnosis not present

## 2022-10-17 ENCOUNTER — Ambulatory Visit (INDEPENDENT_AMBULATORY_CARE_PROVIDER_SITE_OTHER): Payer: Commercial Managed Care - PPO

## 2022-10-17 DIAGNOSIS — J309 Allergic rhinitis, unspecified: Secondary | ICD-10-CM

## 2022-10-22 ENCOUNTER — Ambulatory Visit (INDEPENDENT_AMBULATORY_CARE_PROVIDER_SITE_OTHER): Payer: Commercial Managed Care - PPO

## 2022-10-22 ENCOUNTER — Other Ambulatory Visit: Payer: Self-pay | Admitting: Internal Medicine

## 2022-10-22 DIAGNOSIS — J309 Allergic rhinitis, unspecified: Secondary | ICD-10-CM

## 2022-10-22 DIAGNOSIS — J3089 Other allergic rhinitis: Secondary | ICD-10-CM

## 2022-10-22 NOTE — Progress Notes (Signed)
AIT remix-going from 3 to 2 vials.

## 2022-10-23 NOTE — Progress Notes (Signed)
Aeroallergen Immunotherapy   Ordering Provider: Dr. Sigurd Sos   Patient Details  Name: Seth Lowery  MRN: [id number]  Date of Birth: Nov 03, 1970   Order 2 of 2   Vial Label: DM-M-CR   0.2 ml (Volume)  1:20 Concentration -- Alternaria alternata  0.2 ml (Volume)  1:20 Concentration -- Cladosporium herbarum  0.2 ml (Volume)  1:10 Concentration -- Aspergillus mix  0.2 ml (Volume)  1:10 Concentration -- Penicillium mix  0.2 ml (Volume)  1:20 Concentration -- Bipolaris sorokiniana  0.2 ml (Volume)  1:20 Concentration -- Drechslera spicifera  0.2 ml (Volume)  1:10 Concentration -- Mucor plumbeus  0.2 ml (Volume)  1:10 Concentration -- Fusarium moniliforme  0.2 ml (Volume)  1:40 Concentration -- Aureobasidium pullulans  0.2 ml (Volume)  1:10 Concentration -- Rhizopus oryzae  0.3 ml (Volume)  1:20 Concentration -- Cockroach, German  0.8 ml (Volume)   AU Concentration -- Mite Mix (DF 5,000 & DP 5,000)    3.1  ml Extract Subtotal  1.9  ml Diluent  5.0  ml Maintenance Total   Schedule:  B  Blue Vial (1:100,000): Schedule B (6 doses)  Yellow Vial (1:10,000): Schedule B (6 doses)  Green Vial (1:1,000): Schedule B (6 doses)  Red Vial (1:100): Schedule A (10 doses)   Special Instructions: needs to start in blue and build due to added antigens.

## 2022-10-23 NOTE — Progress Notes (Signed)
MAKE VIALS IN [facility].

## 2022-10-23 NOTE — Progress Notes (Signed)
Aeroallergen Immunotherapy  Ordering Provider: Dr. Sigurd Sos  Patient Details Name: Bradrick Kamau MRN: [id number] Date of Birth: 06/19/1971  Order 1 of 2  Vial Label: G/W/T/Dog  0.3 ml (Volume)  BAU Concentration -- 7 Grass Mix* 100,000 ([address] [place], [place], [place], [place], RedTop, Sweet Vernal, Timothy) 0.2 ml (Volume)  1:20 Concentration -- Bahia 0.3 ml (Volume)  BAU Concentration -- [place] 10,000 0.2 ml (Volume)  1:20 Concentration -- Johnson 0.3 ml (Volume)  1:20 Concentration -- Ragweed Mix 0.5 ml (Volume)  1:20 Concentration -- Weed Mix* 0.5 ml (Volume)  1:20 Concentration -- Eastern 10 Tree Mix (also Sweet Gum) 0.2 ml (Volume)  1:20 Concentration -- Box Elder 0.2 ml (Volume)  1:10 Concentration -- Cedar, red 0.2 ml (Volume)  1:10 Concentration -- Pecan Pollen 0.2 ml (Volume)  1:10 Concentration -- Pine Mix 0.2 ml (Volume)  1:20 Concentration -- Walnut, Black Pollen 0.5 ml (Volume)  1:10 Concentration -- Dog Epithelia   3.8  ml Extract Subtotal 1.2  ml Diluent 5.0  ml Maintenance Total  Schedule:  C Red Vial (1:100): Schedule C (5 doses)  Special Instructions: schedule C red

## 2022-10-31 ENCOUNTER — Ambulatory Visit (INDEPENDENT_AMBULATORY_CARE_PROVIDER_SITE_OTHER): Payer: Commercial Managed Care - PPO

## 2022-10-31 DIAGNOSIS — J309 Allergic rhinitis, unspecified: Secondary | ICD-10-CM | POA: Diagnosis not present

## 2022-11-02 DIAGNOSIS — J3081 Allergic rhinitis due to animal (cat) (dog) hair and dander: Secondary | ICD-10-CM | POA: Diagnosis not present

## 2022-11-02 NOTE — Progress Notes (Signed)
EXP 11/03/23

## 2022-11-05 DIAGNOSIS — J3089 Other allergic rhinitis: Secondary | ICD-10-CM | POA: Diagnosis not present

## 2022-11-12 ENCOUNTER — Ambulatory Visit (INDEPENDENT_AMBULATORY_CARE_PROVIDER_SITE_OTHER): Payer: Commercial Managed Care - PPO

## 2022-11-12 DIAGNOSIS — J309 Allergic rhinitis, unspecified: Secondary | ICD-10-CM | POA: Diagnosis not present

## 2022-12-05 ENCOUNTER — Ambulatory Visit (INDEPENDENT_AMBULATORY_CARE_PROVIDER_SITE_OTHER): Payer: Commercial Managed Care - PPO

## 2022-12-05 DIAGNOSIS — J309 Allergic rhinitis, unspecified: Secondary | ICD-10-CM

## 2022-12-10 ENCOUNTER — Other Ambulatory Visit: Payer: Self-pay | Admitting: Allergy and Immunology

## 2022-12-19 ENCOUNTER — Other Ambulatory Visit: Payer: Self-pay | Admitting: Internal Medicine

## 2022-12-19 MED ORDER — CARBINOXAMINE MALEATE 4 MG/5ML PO SOLN: 10.0000 mL | Freq: Two times a day (BID) | ORAL | 5 refills | Status: DC

## 2022-12-27 ENCOUNTER — Ambulatory Visit (INDEPENDENT_AMBULATORY_CARE_PROVIDER_SITE_OTHER): Payer: Commercial Managed Care - PPO

## 2022-12-27 DIAGNOSIS — J309 Allergic rhinitis, unspecified: Secondary | ICD-10-CM

## 2023-01-15 ENCOUNTER — Ambulatory Visit (INDEPENDENT_AMBULATORY_CARE_PROVIDER_SITE_OTHER): Payer: Commercial Managed Care - PPO

## 2023-01-15 DIAGNOSIS — J309 Allergic rhinitis, unspecified: Secondary | ICD-10-CM | POA: Diagnosis not present

## 2023-02-05 NOTE — Progress Notes (Unsigned)
FOLLOW UP Date of Service/Encounter:  02/07/23   Subjective:  Seth Lowery (DOB: 1971-01-14) is a 52 y.o. male [facility] in re-evaluation of the following: allergic rhinitis, reactive airway disease History obtained from: chart review and patient.  For Review, LV was on 10/04/22  with Dr.Karah Caruthers seen for  updated allergy testing . See below for summary of history and diagnostics.  Therapeutic plans/changes recommended: we did write for new vials to include aspergillus and increase dust mite concentration  Pertinent history and diagnostics:  Started AIT 05/30/20.  2 vials-weeds trees #1, grass, ragweed, dust mites, dog #2, mold, cockroach #3 2018 Chest CT: Fatty infiltration of the liver. No significant abnormality is seen in the chest. 2022 CT sinus: 1. Moderate to severe mucosal thickening throughout the paranasal sinuses with fluid levels in the maxillary sinuses potentially indicating acute on chronic sinusitis. 2. Small left mastoid effusion. Previous spirometrys all showing possible restriction, no obstruction.  Clinically improved with ICS. - repeat allergy testing 10/04/22 + hickory tree on skin testing, intradermal's positive to mold 1, mold 2, mold 3, mold 4, dust mite, cockroach, weed mix and 7 grass mix  - vial containing molds remade-vial 1: DM/M/CR: aspergillus added and DM volume increased; vial 2: G/W/T/Dog   Today presents for follow-up. Allergic rhinitis: using carbinoxamine 10 mL twice daily and atrovent nasal spray twice daily. He feels this is as good as he has been in some time. He is receiving his last dose of his old vials today in clinic. He has not started on his newer vials. Cough and wheeze: rare he is coughing up mucus, maybe once or twice per week.  Dulera only using in the morning, continues on omeprazole. He is happy with his level of control. Infrequent albuterol use.   Allergies as of 02/07/2023   No  Known Allergies      Medication List        Accurate as of Feb 07, 2023  1:22 PM. If you have any questions, ask your nurse or doctor.          albuterol 108 (90 Base) MCG/ACT inhaler Commonly known as: VENTOLIN HFA Inhale 1-2 puffs into the lungs every 6 (six) hours as needed for wheezing or shortness of breath.   Dulera 200-5 MCG/ACT Aero Generic drug: mometasone-formoterol Inhale 2 puffs into the lungs 2 (two) times daily.   EPINEPHrine 0.3 mg/0.3 mL Soaj injection Commonly known as: EpiPen 2-Pak Inject 0.3 mg into the muscle once as needed for up to 1 dose for anaphylaxis.   ipratropium 0.06 % nasal spray Commonly known as: ATROVENT Place 2 sprays into both nostrils 2 (two) times daily.   Lenor Derrick ER 4 MG/5ML Suer Generic drug: Carbinoxamine Maleate ER TAKE 10 MLS BY MOUTH TWICE A DAY AS NEEDED   Carbinoxamine Maleate 4 MG/5ML Soln Take 10 mLs (8 mg total) by mouth in the morning and at bedtime.   Mupirocin Powd Place 20 mg into the nose in the morning and at bedtime. MIX WITH MOMETASONE 0.6MG    omeprazole 40 MG capsule Commonly known as: PRILOSEC Take one daily before breakfast   testosterone cypionate 200 MG/ML injection Commonly known as: DEPOTESTOSTERONE CYPIONATE INJECT 1 ML (200 MG TOTAL) INTO THE MUSCLE EVERY 14 DAYS       Past Medical History:  Diagnosis Date   Allergy    Chronic cough    from sinusitis   Hyperlipidemia    no meds  Recurrent sinusitis    Past Surgical History:  Procedure Laterality Date   ADENOIDECTOMY     ETHMOIDECTOMY Bilateral 11/07/2020   Procedure: ENDOSCOPIC ETHMOIDECTOMY;  Surgeon: Newman Pies, MD;  Location: [facility];  Service: ENT;  Laterality: Bilateral;   FRONTAL SINUS EXPLORATION Bilateral 11/07/2020   Procedure: ENDOSCOPIC FRONTAL RECESS SINUS EXPLORATION;  Surgeon: Newman Pies, MD;  Location: [facility];  Service: ENT;  Laterality: Bilateral;   MAXILLARY ANTROSTOMY Bilateral  11/07/2020   Procedure: ENDOSCOPIC MAXILLARY ANTROSTOMY WITH TISSUE REMOVAL;  Surgeon: Newman Pies, MD;  Location: [facility];  Service: ENT;  Laterality: Bilateral;   NASAL SINUS SURGERY  08/05/2018   SINUS ENDO WITH FUSION Bilateral 11/07/2020   Procedure: SINUS ENDOSCOPY WITH FUSION NAVIGATION;  Surgeon: Newman Pies, MD;  Location: [facility];  Service: ENT;  Laterality: Bilateral;   SINUS SURGERY WITH INSTATRAK     SINUS SURGERY WITH INSTATRAK     SPHENOIDECTOMY Bilateral 11/07/2020   Procedure: ENDOSCOPIC SPHENOIDECTOMY WITH TISSUE REMOVAL;  Surgeon: Newman Pies, MD;  Location: [facility];  Service: ENT;  Laterality: Bilateral;   TONSILLECTOMY     Otherwise, there have been no changes to his past medical history, surgical history, family history, or social history.  ROS: All others negative except as noted per HPI.   Objective:  BP 128/76 (BP Location: Left Arm, Patient Position: Sitting, Cuff Size: Normal)   Pulse 70   Temp 98.4 F (36.9 C) (Temporal)   Resp 18   Ht 6\' 4"  (1.93 m)   Wt 232 lb 11.2 oz (105.6 kg)   SpO2 96%   BMI 28.33 kg/m  Body mass index is 28.33 kg/m. Physical Exam: General Appearance:  Alert, cooperative, no distress, appears stated age  Head:  Normocephalic, without obvious abnormality, atraumatic  Eyes:  Conjunctiva clear, EOM's intact  Nose: Nares normal, hypertrophic turbinates and normal mucosa  Throat: Lips, tongue normal; teeth and gums normal, normal posterior oropharynx  Neck: Supple, symmetrical  Lungs:   clear to auscultation bilaterally, Respirations unlabored, no coughing  Heart:  regular rate and rhythm and no murmur, Appears well perfused  Extremities: No edema  Skin: Skin color, texture, turgor normal and no rashes or lesions on visualized portions of skin  Neurologic: No gross deficits    Spirometry:  Tracings reviewed. His effort: Good reproducible efforts. FVC: 4.54L FEV1: 3.56L, 76%  predicted FEV1/FVC ratio: 0.78 Interpretation: Spirometry consistent with possible restrictive disease. Improved from prior. Please see scanned spirometry results for details.  Assessment/Plan   Seasonal and perennial allergic rhinitis-improved Continue allergy injections per protocol and bring epipen to your appointments.  Continue Carbinaxomine 4mg /50ml -take 10ml twice a day as needed.  Continue Atrovent nasal spray 2 sprays each nostril daily (can be used up to 3-4 times a day as needed for nasal drainage control).  If nasal passage is getting too dry then decrease and/or stop use Continue your nasal rinse regimen with mometasone and mupirocin as directed by Dr. Nelda Severe can take over these prescriptions if we need to  Received injection in clinic. Will switch to new vials at next injection visit.  Cough persistent and Wheeze-improved Breathing test today looks good. Have access to albuterol inhaler 2 puffs every 4-6 hours as needed for cough/wheeze/shortness of breath/chest tightness.  May use 15-20 minutes prior to activity.   Monitor frequency of use.   Dulera 100 mcg 2 puffs daily. Increase to twice daily during coughing flares and continue  for 2 weeks or until symptoms resolve Omeprazole 40 mg and take 30 minutes prior to breakfast daily.   Follow-up in 6 months or sooner if needed  It was a pleasure seeing you again in clinic today! Thank you for allowing me to participate in your care.  Tonny Bollman, MD  [facility]

## 2023-02-07 ENCOUNTER — Other Ambulatory Visit: Payer: Self-pay

## 2023-02-07 ENCOUNTER — Encounter: Payer: Self-pay | Admitting: Internal Medicine

## 2023-02-07 ENCOUNTER — Ambulatory Visit: Payer: Commercial Managed Care - PPO | Admitting: Internal Medicine

## 2023-02-07 VITALS — BP 128/76 | HR 70 | Temp 98.4°F | Resp 18 | Ht 76.0 in | Wt 232.7 lb

## 2023-02-07 DIAGNOSIS — K219 Gastro-esophageal reflux disease without esophagitis: Secondary | ICD-10-CM | POA: Diagnosis not present

## 2023-02-07 DIAGNOSIS — R053 Chronic cough: Secondary | ICD-10-CM | POA: Diagnosis not present

## 2023-02-07 DIAGNOSIS — J309 Allergic rhinitis, unspecified: Secondary | ICD-10-CM | POA: Diagnosis not present

## 2023-02-07 DIAGNOSIS — J3089 Other allergic rhinitis: Secondary | ICD-10-CM

## 2023-02-07 DIAGNOSIS — J4530 Mild persistent asthma, uncomplicated: Secondary | ICD-10-CM

## 2023-02-07 DIAGNOSIS — J45909 Unspecified asthma, uncomplicated: Secondary | ICD-10-CM | POA: Insufficient documentation

## 2023-02-07 MED ORDER — CARBINOXAMINE MALEATE 4 MG/5ML PO SOLN: 10.0000 mL | Freq: Two times a day (BID) | ORAL | 5 refills | Status: DC | PRN

## 2023-02-07 MED ORDER — OMEPRAZOLE 40 MG PO CPDR: DELAYED_RELEASE_CAPSULE | ORAL | 5 refills | Status: DC

## 2023-02-07 MED ORDER — IPRATROPIUM BROMIDE 0.06 % NA SOLN: 2.0000 | Freq: Two times a day (BID) | NASAL | 5 refills | Status: DC | PRN

## 2023-02-07 MED ORDER — EPINEPHRINE 0.3 MG/0.3ML IJ SOAJ: 0.3000 mg | Freq: Once | INTRAMUSCULAR | 2 refills | Status: DC | PRN

## 2023-02-07 MED ORDER — ALBUTEROL SULFATE HFA 108 (90 BASE) MCG/ACT IN AERS: 1.0000 | INHALATION_SPRAY | Freq: Four times a day (QID) | RESPIRATORY_TRACT | 1 refills | Status: DC | PRN

## 2023-02-07 MED ORDER — DULERA 200-5 MCG/ACT IN AERO: 2.0000 | INHALATION_SPRAY | Freq: Two times a day (BID) | RESPIRATORY_TRACT | 5 refills | Status: DC

## 2023-02-07 NOTE — Patient Instructions (Addendum)
Seasonal and perennial allergic rhinitis Continue allergy injections per protocol and bring epipen to your appointments.  Continue Carbinaxomine 4mg /92ml -take 10ml twice a day as needed.  Continue Atrovent nasal spray 2 sprays each nostril daily (can be used up to 3-4 times a day as needed for nasal drainage control).  If nasal passage is getting too dry then decrease and/or stop use Continue your nasal rinse regimen with mometasone and mupirocin as directed by Dr. Nelda Severe can take over these prescriptions if we need to  Cough persistent and Wheeze Breathing test today looks good Have access to albuterol inhaler 2 puffs every 4-6 hours as needed for cough/wheeze/shortness of breath/chest tightness.  May use 15-20 minutes prior to activity.   Monitor frequency of use.   Dulera 100 mcg 2 puffs daily. Increase to twice daily during coughing flares and continue for 2 weeks or until symptoms resolve Omeprazole 40 mg and take 30 minutes prior to breakfast daily.   Follow-up in 6 months or sooner if needed  It was a pleasure seeing you again in clinic today! Thank you for allowing me to participate in your care.  Tonny Bollman, MD [facility]

## 2023-02-09 ENCOUNTER — Other Ambulatory Visit: Payer: Self-pay | Admitting: Family Medicine

## 2023-02-09 DIAGNOSIS — E291 Testicular hypofunction: Secondary | ICD-10-CM

## 2023-02-14 ENCOUNTER — Ambulatory Visit (INDEPENDENT_AMBULATORY_CARE_PROVIDER_SITE_OTHER): Payer: Commercial Managed Care - PPO

## 2023-02-14 DIAGNOSIS — J309 Allergic rhinitis, unspecified: Secondary | ICD-10-CM | POA: Diagnosis not present

## 2023-02-22 ENCOUNTER — Ambulatory Visit (INDEPENDENT_AMBULATORY_CARE_PROVIDER_SITE_OTHER): Payer: Commercial Managed Care - PPO

## 2023-02-22 DIAGNOSIS — J309 Allergic rhinitis, unspecified: Secondary | ICD-10-CM

## 2023-03-01 ENCOUNTER — Ambulatory Visit (INDEPENDENT_AMBULATORY_CARE_PROVIDER_SITE_OTHER): Payer: Commercial Managed Care - PPO

## 2023-03-01 DIAGNOSIS — J309 Allergic rhinitis, unspecified: Secondary | ICD-10-CM | POA: Diagnosis not present

## 2023-03-08 ENCOUNTER — Ambulatory Visit (INDEPENDENT_AMBULATORY_CARE_PROVIDER_SITE_OTHER): Payer: Commercial Managed Care - PPO

## 2023-03-08 DIAGNOSIS — J309 Allergic rhinitis, unspecified: Secondary | ICD-10-CM | POA: Diagnosis not present

## 2023-03-15 ENCOUNTER — Ambulatory Visit (INDEPENDENT_AMBULATORY_CARE_PROVIDER_SITE_OTHER): Payer: Commercial Managed Care - PPO

## 2023-03-15 DIAGNOSIS — J309 Allergic rhinitis, unspecified: Secondary | ICD-10-CM

## 2023-03-20 ENCOUNTER — Ambulatory Visit (INDEPENDENT_AMBULATORY_CARE_PROVIDER_SITE_OTHER): Payer: Commercial Managed Care - PPO

## 2023-03-20 DIAGNOSIS — J309 Allergic rhinitis, unspecified: Secondary | ICD-10-CM | POA: Diagnosis not present

## 2023-03-28 ENCOUNTER — Ambulatory Visit (INDEPENDENT_AMBULATORY_CARE_PROVIDER_SITE_OTHER): Payer: Commercial Managed Care - PPO

## 2023-03-28 DIAGNOSIS — J309 Allergic rhinitis, unspecified: Secondary | ICD-10-CM

## 2023-04-08 ENCOUNTER — Ambulatory Visit: Payer: Self-pay

## 2023-04-08 DIAGNOSIS — J309 Allergic rhinitis, unspecified: Secondary | ICD-10-CM | POA: Diagnosis not present

## 2023-04-15 ENCOUNTER — Ambulatory Visit (INDEPENDENT_AMBULATORY_CARE_PROVIDER_SITE_OTHER): Payer: Commercial Managed Care - PPO

## 2023-04-15 DIAGNOSIS — J309 Allergic rhinitis, unspecified: Secondary | ICD-10-CM | POA: Diagnosis not present

## 2023-04-22 ENCOUNTER — Ambulatory Visit (INDEPENDENT_AMBULATORY_CARE_PROVIDER_SITE_OTHER): Payer: Commercial Managed Care - PPO

## 2023-04-22 DIAGNOSIS — J309 Allergic rhinitis, unspecified: Secondary | ICD-10-CM | POA: Diagnosis not present

## 2023-04-23 ENCOUNTER — Encounter: Payer: Self-pay | Admitting: Family Medicine

## 2023-04-23 ENCOUNTER — Ambulatory Visit (INDEPENDENT_AMBULATORY_CARE_PROVIDER_SITE_OTHER): Payer: Commercial Managed Care - PPO | Admitting: Family Medicine

## 2023-04-23 VITALS — BP 118/70 | HR 57 | Temp 98.0°F | Ht 76.0 in | Wt 226.6 lb

## 2023-04-23 DIAGNOSIS — Z131 Encounter for screening for diabetes mellitus: Secondary | ICD-10-CM | POA: Diagnosis not present

## 2023-04-23 DIAGNOSIS — Z0000 Encounter for general adult medical examination without abnormal findings: Secondary | ICD-10-CM | POA: Diagnosis not present

## 2023-04-23 DIAGNOSIS — Z13220 Encounter for screening for lipoid disorders: Secondary | ICD-10-CM | POA: Diagnosis not present

## 2023-04-23 DIAGNOSIS — E291 Testicular hypofunction: Secondary | ICD-10-CM

## 2023-04-23 DIAGNOSIS — Z125 Encounter for screening for malignant neoplasm of prostate: Secondary | ICD-10-CM | POA: Diagnosis not present

## 2023-04-23 LAB — LIPID PANEL
Cholesterol: 174 mg/dL (ref 0–200)
HDL: 51 mg/dL (ref >39.00)
LDL Cholesterol: 105 mg/dL — ABNORMAL HIGH (ref 0–99)
NonHDL: 123.12
Total CHOL/HDL Ratio: 3
Triglycerides: 91 mg/dL (ref 0.0–149.0)
VLDL: 18.2 mg/dL (ref 0.0–40.0)

## 2023-04-23 LAB — URINALYSIS, ROUTINE W REFLEX MICROSCOPIC
Bilirubin Urine: NEGATIVE
Hgb urine dipstick: NEGATIVE
Ketones, ur: NEGATIVE
Leukocytes,Ua: NEGATIVE
Nitrite: NEGATIVE
RBC / HPF: NONE SEEN (ref 0–?)
Specific Gravity, Urine: <=1.005 — AB (ref 1.000–1.030)
Total Protein, Urine: NEGATIVE
Urine Glucose: NEGATIVE
Urobilinogen, UA: 0.2 (ref 0.0–1.0)
WBC, UA: NONE SEEN (ref 0–?)
pH: 7 (ref 5.0–8.0)

## 2023-04-23 LAB — COMPREHENSIVE METABOLIC PANEL
ALT: 37 U/L (ref 0–53)
AST: 30 U/L (ref 0–37)
Albumin: 4.5 g/dL (ref 3.5–5.2)
Alkaline Phosphatase: 60 U/L (ref 39–117)
BUN: 17 mg/dL (ref 6–23)
CO2: 25 mEq/L (ref 19–32)
Calcium: 9.2 mg/dL (ref 8.4–10.5)
Chloride: 105 mEq/L (ref 96–112)
Creatinine, Ser: 1.07 mg/dL (ref 0.40–1.50)
GFR: 80.2 mL/min (ref >60.00)
Glucose, Bld: 96 mg/dL (ref 70–99)
Potassium: 4.6 mEq/L (ref 3.5–5.1)
Sodium: 140 mEq/L (ref 135–145)
Total Bilirubin: 0.8 mg/dL (ref 0.2–1.2)
Total Protein: 6.7 g/dL (ref 6.0–8.3)

## 2023-04-23 LAB — CBC WITH DIFFERENTIAL/PLATELET
Basophils Absolute: 0 10*3/uL (ref 0.0–0.1)
Basophils Relative: 0.6 % (ref 0.0–3.0)
Eosinophils Absolute: 0.1 10*3/uL (ref 0.0–0.7)
Eosinophils Relative: 2.1 % (ref 0.0–5.0)
HCT: 49.7 % (ref 39.0–52.0)
Hemoglobin: 16.4 g/dL (ref 13.0–17.0)
Lymphocytes Relative: 28.9 % (ref 12.0–46.0)
Lymphs Abs: 1.6 10*3/uL (ref 0.7–4.0)
MCHC: 33 g/dL (ref 30.0–36.0)
MCV: 86.6 fl (ref 78.0–100.0)
Monocytes Absolute: 0.5 10*3/uL (ref 0.1–1.0)
Monocytes Relative: 8.6 % (ref 3.0–12.0)
Neutro Abs: 3.4 10*3/uL (ref 1.4–7.7)
Neutrophils Relative %: 59.8 % (ref 43.0–77.0)
Platelets: 240 10*3/uL (ref 150.0–400.0)
RBC: 5.74 Mil/uL (ref 4.22–5.81)
RDW: 13.8 % (ref 11.5–15.5)
WBC: 5.7 10*3/uL (ref 4.0–10.5)

## 2023-04-23 LAB — PSA: PSA: 0.6 ng/mL (ref 0.10–4.00)

## 2023-04-23 LAB — HEMOGLOBIN A1C: Hgb A1c MFr Bld: 5.7 % (ref 4.6–6.5)

## 2023-04-23 NOTE — Progress Notes (Signed)
Established Patient Office Visit   Subjective:  Patient ID: Seth Lowery, male    DOB: [birth year]  Age: 52 y.o. MRN: [id number]  Chief Complaint  Patient presents with   Annual Exam    CPE. Pt is fasting. Doing well. Has Wellness to fill out and fax.     HPI Encounter Diagnoses  Name Primary?   Healthcare maintenance Yes   Androgen deficiency    Screening for hyperlipidemia    Screening for diabetes mellitus    Screening for prostate cancer    For physical and follow-up on androgen deficiency treated with biweekly testosterone.  Doing quite well.  No issues with the testosterone.  Has been able to lose 38 pounds with exercise by walking and portion control.  Feels great.  Continues to donate at the [facility] periodically.   Review of Systems  Constitutional: Negative.   HENT: Negative.    Eyes:  Negative for blurred vision, discharge and redness.  Respiratory: Negative.    Cardiovascular: Negative.   Gastrointestinal:  Negative for abdominal pain.  Genitourinary: Negative.   Musculoskeletal: Negative.  Negative for myalgias.  Skin:  Negative for rash.  Neurological:  Negative for tingling, loss of consciousness and weakness.  Endo/Heme/Allergies:  Negative for polydipsia.     Current Outpatient Medications:    albuterol (VENTOLIN HFA) 108 (90 Base) MCG/ACT inhaler, Inhale 1-2 puffs into the lungs every 6 (six) hours as needed for wheezing or shortness of breath., Disp: 18 g, Rfl: 1   Carbinoxamine Maleate 4 MG/5ML SOLN, Take 10 mLs (8 mg total) by mouth 2 (two) times daily as needed., Disp: 473 mL, Rfl: 5   EPINEPHrine (EPIPEN 2-PAK) 0.3 mg/0.3 mL IJ SOAJ injection, Inject 0.3 mg into the muscle once as needed for up to 1 dose for anaphylaxis., Disp: 1 each, Rfl: 2   ipratropium (ATROVENT) 0.06 % nasal spray, Place 2 sprays into both nostrils 2 (two) times daily as needed for rhinitis., Disp: 15 mL, Rfl: 5   mometasone-formoterol (DULERA) 200-5 MCG/ACT AERO, Inhale 2  puffs into the lungs 2 (two) times daily., Disp: 1 each, Rfl: 5   omeprazole (PRILOSEC) 40 MG capsule, Take one daily before breakfast, Disp: 30 capsule, Rfl: 5   testosterone cypionate (DEPOTESTOSTERONE CYPIONATE) 200 MG/ML injection, INJECT 1 ML (200 MG TOTAL) INTO THE MUSCLE EVERY 14 DAYS, Disp: 6 mL, Rfl: 1   Mupirocin POWD, Place 20 mg into the nose in the morning and at bedtime. MIX WITH MOMETASONE 0.6MG , Disp: , Rfl:    Objective:     BP 118/70   Pulse (!) 57   Temp 98 F (36.7 C)   Ht 6\' 4"  (1.93 m)   Wt 226 lb 9.6 oz (102.8 kg)   SpO2 97%   BMI 27.58 kg/m  BP Readings from Last 3 Encounters:  04/23/23 118/70  02/07/23 128/76  10/04/22 132/76   Wt Readings from Last 3 Encounters:  04/23/23 226 lb 9.6 oz (102.8 kg)  02/07/23 232 lb 11.2 oz (105.6 kg)  08/24/22 264 lb (119.7 kg)      Physical Exam Constitutional:      General: He is not in acute distress.    Appearance: Normal appearance. He is not ill-appearing, toxic-appearing or diaphoretic.  HENT:     Head: Normocephalic and atraumatic.     Right Ear: Tympanic membrane, ear canal and external ear normal.     Left Ear: Tympanic membrane, ear canal and external ear normal.     Mouth/Throat:  Mouth: Mucous membranes are moist.     Pharynx: Oropharynx is clear. No oropharyngeal exudate or posterior oropharyngeal erythema.  Eyes:     General: No scleral icterus.       Right eye: No discharge.        Left eye: No discharge.     Extraocular Movements: Extraocular movements intact.     Conjunctiva/sclera: Conjunctivae normal.     Pupils: Pupils are equal, round, and reactive to light.  Cardiovascular:     Rate and Rhythm: Normal rate and regular rhythm.  Pulmonary:     Effort: Pulmonary effort is normal. No respiratory distress.     Breath sounds: Normal breath sounds. No wheezing, rhonchi or rales.  Abdominal:     General: Bowel sounds are normal.     Tenderness: There is no abdominal tenderness. There is  no guarding or rebound.     Hernia: There is no hernia in the left inguinal area or right inguinal area.  Genitourinary:    Penis: Circumcised. No hypospadias, erythema, tenderness, discharge, swelling or lesions.      Testes:        Right: Mass, tenderness or swelling not present. Right testis is descended.        Left: Mass, tenderness or swelling not present. Left testis is descended.     Epididymis:     Right: Not inflamed or enlarged.     Left: Not inflamed or enlarged.  Musculoskeletal:     Cervical back: No rigidity or tenderness.  Lymphadenopathy:     Lower Body: No right inguinal adenopathy. No left inguinal adenopathy.  Skin:    General: Skin is warm and dry.  Neurological:     Mental Status: He is alert and oriented to person, place, and time.  Psychiatric:        Mood and Affect: Mood normal.        Behavior: Behavior normal.      No results found for any visits on 04/23/23.    The 10-year ASCVD risk score (Arnett DK, et al., 2019) is: 3.2%    Assessment & Plan:   Healthcare maintenance -     CBC with Differential/Platelet -     Comprehensive metabolic panel -     Urinalysis, Routine w reflex microscopic  Androgen deficiency -     CBC with Differential/Platelet -     PSA  Screening for hyperlipidemia -     Lipid panel  Screening for diabetes mellitus -     Hemoglobin A1c  Screening for prostate cancer -     PSA    Return in about 1 year (around 04/22/2024), or if symptoms worsen or fail to improve.  Encouraged him to continue his healthy active lifestyle.  Information was given on health maintenance and disease prevention.  Mliss Sax, MD

## 2023-04-29 DIAGNOSIS — J3081 Allergic rhinitis due to animal (cat) (dog) hair and dander: Secondary | ICD-10-CM

## 2023-04-29 NOTE — Progress Notes (Signed)
Vial Exp 04/28/24

## 2023-04-30 ENCOUNTER — Ambulatory Visit (INDEPENDENT_AMBULATORY_CARE_PROVIDER_SITE_OTHER): Payer: Commercial Managed Care - PPO

## 2023-04-30 DIAGNOSIS — J309 Allergic rhinitis, unspecified: Secondary | ICD-10-CM

## 2023-05-06 ENCOUNTER — Ambulatory Visit (INDEPENDENT_AMBULATORY_CARE_PROVIDER_SITE_OTHER): Payer: Commercial Managed Care - PPO

## 2023-05-06 DIAGNOSIS — J309 Allergic rhinitis, unspecified: Secondary | ICD-10-CM

## 2023-05-12 ENCOUNTER — Other Ambulatory Visit: Payer: Self-pay | Admitting: Internal Medicine

## 2023-05-13 ENCOUNTER — Ambulatory Visit (INDEPENDENT_AMBULATORY_CARE_PROVIDER_SITE_OTHER): Payer: Commercial Managed Care - PPO

## 2023-05-13 DIAGNOSIS — J309 Allergic rhinitis, unspecified: Secondary | ICD-10-CM | POA: Diagnosis not present

## 2023-05-24 ENCOUNTER — Ambulatory Visit (INDEPENDENT_AMBULATORY_CARE_PROVIDER_SITE_OTHER): Payer: Commercial Managed Care - PPO

## 2023-05-24 DIAGNOSIS — J309 Allergic rhinitis, unspecified: Secondary | ICD-10-CM | POA: Diagnosis not present

## 2023-05-31 ENCOUNTER — Ambulatory Visit (INDEPENDENT_AMBULATORY_CARE_PROVIDER_SITE_OTHER): Payer: Commercial Managed Care - PPO

## 2023-05-31 DIAGNOSIS — J309 Allergic rhinitis, unspecified: Secondary | ICD-10-CM

## 2023-06-10 ENCOUNTER — Ambulatory Visit (INDEPENDENT_AMBULATORY_CARE_PROVIDER_SITE_OTHER): Payer: Commercial Managed Care - PPO

## 2023-06-10 DIAGNOSIS — J309 Allergic rhinitis, unspecified: Secondary | ICD-10-CM | POA: Diagnosis not present

## 2023-06-21 ENCOUNTER — Ambulatory Visit (INDEPENDENT_AMBULATORY_CARE_PROVIDER_SITE_OTHER): Payer: Commercial Managed Care - PPO

## 2023-06-21 DIAGNOSIS — J309 Allergic rhinitis, unspecified: Secondary | ICD-10-CM | POA: Diagnosis not present

## 2023-06-26 NOTE — Progress Notes (Signed)
EXP 06/27/24

## 2023-06-28 ENCOUNTER — Ambulatory Visit (INDEPENDENT_AMBULATORY_CARE_PROVIDER_SITE_OTHER): Payer: Commercial Managed Care - PPO

## 2023-06-28 DIAGNOSIS — J309 Allergic rhinitis, unspecified: Secondary | ICD-10-CM

## 2023-07-01 DIAGNOSIS — J301 Allergic rhinitis due to pollen: Secondary | ICD-10-CM | POA: Diagnosis not present

## 2023-07-05 ENCOUNTER — Ambulatory Visit (INDEPENDENT_AMBULATORY_CARE_PROVIDER_SITE_OTHER): Payer: Commercial Managed Care - PPO

## 2023-07-05 DIAGNOSIS — J309 Allergic rhinitis, unspecified: Secondary | ICD-10-CM | POA: Diagnosis not present

## 2023-07-11 ENCOUNTER — Ambulatory Visit (INDEPENDENT_AMBULATORY_CARE_PROVIDER_SITE_OTHER): Payer: Commercial Managed Care - PPO

## 2023-07-11 DIAGNOSIS — J309 Allergic rhinitis, unspecified: Secondary | ICD-10-CM

## 2023-07-18 ENCOUNTER — Ambulatory Visit (INDEPENDENT_AMBULATORY_CARE_PROVIDER_SITE_OTHER): Payer: Commercial Managed Care - PPO | Admitting: *Deleted

## 2023-07-18 DIAGNOSIS — J309 Allergic rhinitis, unspecified: Secondary | ICD-10-CM

## 2023-07-25 ENCOUNTER — Ambulatory Visit (INDEPENDENT_AMBULATORY_CARE_PROVIDER_SITE_OTHER): Payer: Commercial Managed Care - PPO

## 2023-07-25 DIAGNOSIS — J309 Allergic rhinitis, unspecified: Secondary | ICD-10-CM

## 2023-08-02 ENCOUNTER — Ambulatory Visit (INDEPENDENT_AMBULATORY_CARE_PROVIDER_SITE_OTHER): Payer: Commercial Managed Care - PPO

## 2023-08-02 DIAGNOSIS — J309 Allergic rhinitis, unspecified: Secondary | ICD-10-CM

## 2023-08-09 ENCOUNTER — Ambulatory Visit (INDEPENDENT_AMBULATORY_CARE_PROVIDER_SITE_OTHER): Payer: Commercial Managed Care - PPO | Admitting: *Deleted

## 2023-08-09 DIAGNOSIS — J309 Allergic rhinitis, unspecified: Secondary | ICD-10-CM | POA: Diagnosis not present

## 2023-08-14 ENCOUNTER — Ambulatory Visit (INDEPENDENT_AMBULATORY_CARE_PROVIDER_SITE_OTHER): Payer: Commercial Managed Care - PPO

## 2023-08-14 DIAGNOSIS — J309 Allergic rhinitis, unspecified: Secondary | ICD-10-CM | POA: Diagnosis not present

## 2023-08-22 ENCOUNTER — Ambulatory Visit: Payer: Commercial Managed Care - PPO | Admitting: Internal Medicine

## 2023-08-22 ENCOUNTER — Ambulatory Visit: Payer: Self-pay

## 2023-08-22 ENCOUNTER — Encounter: Payer: Self-pay | Admitting: Internal Medicine

## 2023-08-22 VITALS — BP 122/74 | HR 78 | Temp 98.1°F | Resp 17 | Ht 76.0 in

## 2023-08-22 DIAGNOSIS — J4530 Mild persistent asthma, uncomplicated: Secondary | ICD-10-CM | POA: Diagnosis not present

## 2023-08-22 DIAGNOSIS — J3089 Other allergic rhinitis: Secondary | ICD-10-CM | POA: Diagnosis not present

## 2023-08-22 DIAGNOSIS — J309 Allergic rhinitis, unspecified: Secondary | ICD-10-CM

## 2023-08-22 MED ORDER — DULERA 200-5 MCG/ACT IN AERO: 2.0000 | INHALATION_SPRAY | Freq: Two times a day (BID) | RESPIRATORY_TRACT | 5 refills | Status: DC

## 2023-08-22 NOTE — Patient Instructions (Addendum)
Seasonal and perennial allergic rhinitis Continue allergy injections per protocol and bring epipen to your appointments.  Continue Carbinaxomine 4mg /2ml -take 10ml twice a day as needed.  Continue Atrovent nasal spray 2 sprays each nostril daily (can be used up to 3-4 times a day as needed for nasal drainage control).  If nasal passage is getting too dry then decrease and/or stop use Continue your nasal rinse regimen with mometasone and mupirocin as directed by Dr. Nelda Severe can take over these prescriptions if we need to  Mild Persistent Asthma Have access to albuterol inhaler 2 puffs every 4-6 hours as needed for cough/wheeze/shortness of breath/chest tightness.  May use 15-20 minutes prior to activity.   Monitor frequency of use.   Dulera 100 mcg 2 puffs daily. Increase to twice daily during coughing flares and continue for 2 weeks or until symptoms resolve Discontinue omeprazole.  Follow-up in 6 months or sooner if needed  It was a pleasure seeing you again in clinic today! Thank you for allowing me to participate in your care.  Tonny Bollman, MD [facility]

## 2023-08-22 NOTE — Progress Notes (Signed)
FOLLOW UP Date of Service/Encounter:  08/22/23  Subjective:  Seth Lowery (DOB: 05/15/1971) is a 52 y.o. male who [facility]23 in re-evaluation of the following: llergic rhinitis, mild persistent asthma History obtained from: chart review and patient.  For Review, LV was on 02/07/23  with Dr.Marisel Tostenson seen for routine follow-up. See below for summary of history and diagnostics.   Therapeutic plans/changes recommended: we started newly mixed vials and continued omeprazole for reflux ----------------------------------------------------- Pertinent History/Diagnostics:  Started AIT 05/30/20.  2 vials-weeds trees #1, grass, ragweed, dust mites, dog #2, mold, cockroach #3 2018 Chest CT: Fatty infiltration of the liver. No significant abnormality is seen in the chest. 2022 CT sinus: 1. Moderate to severe mucosal thickening throughout the paranasal sinuses with fluid levels in the maxillary sinuses potentially indicating acute on chronic sinusitis. 2. Small left mastoid effusion. Previous spirometrys all showing possible restriction, no obstruction.  Clinically improved with ICS. - repeat allergy testing 10/04/22 + hickory tree on skin testing, intradermal's positive to mold 1, mold 2, mold 3, mold 4, dust mite, cockroach, weed mix and 7 grass mix  - vial containing molds remade-vial 1: DM/M/CR: aspergillus added and DM volume increased; vial 2: G/W/T/Dog -New vials started 02/14/23, reached maintenance in new vial 08/22/23. --------------------------------------------------- Today presents for follow-up. Discussed the use of AI scribe software for clinical note transcription with the patient, who gave verbal consent to proceed.  History of Present Illness   The patient reports a significant improvement in his overall health status, attributing this to the current treatment regimen. He notes that his condition is the best it has been in years, with no  need for albuterol in recent months. He has not required any steroids or antibiotics since the last visit in May.  The patient has been managing his nasal symptoms with twice-daily ipatropium sprays. However, he reports a peculiar pattern of symptoms, with the severity varying depending on the time of day. He has been taking Dulera in the morning, finding that taking it at night exacerbates post-nasal drip and disrupts his sleep. He has discontinued omeprazole due to a lack of noticeable benefit.  The patient also mentions the use of carbinoxamine, which he seems to find beneficial. He has been managing his medication refills independently, contacting the pharmacy as needed. He has not reported any new symptoms or changes in his condition since the last visit.       All medications reviewed by clinical staff and updated in chart. No new pertinent medical or surgical history except as noted in HPI.  ROS: All others negative except as noted per HPI.   Objective:  BP 122/74 (BP Location: Right Arm, Patient Position: Sitting, Cuff Size: Normal)   Pulse 78   Temp 98.1 F (36.7 C) (Temporal)   Resp 17   Ht 6\' 4"  (1.93 m)   SpO2 96%   BMI 27.58 kg/m  Body mass index is 27.58 kg/m. Physical Exam: General Appearance:  Alert, cooperative, no distress, appears stated age  Head:  Normocephalic, without obvious abnormality, atraumatic  Eyes:  Conjunctiva clear, EOM's intact  Ears EACs normal bilaterally and normal TMs bilaterally  Nose: Nares normal, hypertrophic turbinates, normal mucosa, and no visible anterior polyps  Throat: Lips, tongue normal; teeth and gums normal, normal posterior oropharynx  Neck: Supple, symmetrical  Lungs:   clear to auscultation bilaterally, Respirations unlabored, no coughing  Heart:  regular rate and rhythm and no murmur, Appears well perfused  Extremities: No edema  Skin: Skin color, texture, turgor normal and no rashes or lesions on visualized portions of skin   Neurologic: No gross deficits   Labs:  Lab Orders  No laboratory test(s) ordered today    Spirometry:  Tracings reviewed. His effort:  Effort good but coughing some, affecting repeatability FVC: 4.30L FEV1: 3.03L, 65% predicted FEV1/FVC ratio: 0.70 Interpretation: Spirometry consistent with possible restrictive disease.  Please see scanned spirometry results for details.   Assessment/Plan   Seasonal and perennial allergic rhinitis-stable on AIT Continue allergy injections per protocol and bring epipen to your appointments.  Continue Carbinaxomine 4mg /67ml -take 10ml twice a day as needed.  Continue Atrovent nasal spray 2 sprays each nostril daily (can be used up to 3-4 times a day as needed for nasal drainage control).  If nasal passage is getting too dry then decrease and/or stop use Continue your nasal rinse regimen with mometasone and mupirocin as directed by Dr. Nelda Severe can take over these prescriptions if we need to  Mild Persistent Asthma-at goal Have access to albuterol inhaler 2 puffs every 4-6 hours as needed for cough/wheeze/shortness of breath/chest tightness.  May use 15-20 minutes prior to activity.   Monitor frequency of use.   Dulera 100 mcg 2 puffs daily. Increase to twice daily during coughing flares and continue for 2 weeks or until symptoms resolve Discontinue omeprazole.  Follow-up in 6 months or sooner if needed  It was a pleasure seeing you again in clinic today! Thank you for allowing me to participate in your care.  Other: allergy injection given in clinic today  Tonny Bollman, MD  [facility]

## 2023-09-01 ENCOUNTER — Other Ambulatory Visit: Payer: Self-pay | Admitting: Family Medicine

## 2023-09-01 DIAGNOSIS — E291 Testicular hypofunction: Secondary | ICD-10-CM

## 2023-09-02 NOTE — Telephone Encounter (Signed)
LR  02/11/23,  6 ml, 1 rf LOV  04/23/23 FOV  04/23/24  Please review and advise.  Thanks. Dm/cma

## 2023-09-06 ENCOUNTER — Other Ambulatory Visit: Payer: Self-pay | Admitting: Internal Medicine

## 2023-09-16 ENCOUNTER — Ambulatory Visit (INDEPENDENT_AMBULATORY_CARE_PROVIDER_SITE_OTHER): Payer: Commercial Managed Care - PPO

## 2023-09-16 DIAGNOSIS — J309 Allergic rhinitis, unspecified: Secondary | ICD-10-CM | POA: Diagnosis not present

## 2023-09-26 ENCOUNTER — Ambulatory Visit (INDEPENDENT_AMBULATORY_CARE_PROVIDER_SITE_OTHER): Payer: Commercial Managed Care - PPO

## 2023-09-26 DIAGNOSIS — J309 Allergic rhinitis, unspecified: Secondary | ICD-10-CM | POA: Diagnosis not present

## 2023-10-02 ENCOUNTER — Other Ambulatory Visit: Payer: Self-pay | Admitting: Internal Medicine

## 2023-10-03 ENCOUNTER — Ambulatory Visit (INDEPENDENT_AMBULATORY_CARE_PROVIDER_SITE_OTHER): Payer: Commercial Managed Care - PPO

## 2023-10-03 DIAGNOSIS — J309 Allergic rhinitis, unspecified: Secondary | ICD-10-CM

## 2023-10-04 ENCOUNTER — Telehealth: Payer: Self-pay | Admitting: Family Medicine

## 2023-10-04 NOTE — Telephone Encounter (Signed)
 ERROR

## 2023-10-09 ENCOUNTER — Other Ambulatory Visit: Payer: Self-pay | Admitting: Family Medicine

## 2023-10-09 ENCOUNTER — Ambulatory Visit (INDEPENDENT_AMBULATORY_CARE_PROVIDER_SITE_OTHER): Payer: Commercial Managed Care - PPO

## 2023-10-09 DIAGNOSIS — J309 Allergic rhinitis, unspecified: Secondary | ICD-10-CM

## 2023-10-09 DIAGNOSIS — E291 Testicular hypofunction: Secondary | ICD-10-CM

## 2023-10-09 NOTE — Telephone Encounter (Signed)
Copied from CRM [id number]. Topic: Clinical - Medication Refill >> Oct 09, 2023  1:38 PM Pascal Lux wrote: Most Recent Primary Care Visit:  Provider: Mliss Sax  Department: [facility]  Visit Type: PHYSICAL  Date: 04/23/2023  Medication: testosterone cypionate (DEPOTESTOSTERONE CYPIONATE) 200 MG/ML injection [[id number]]  Has the patient contacted their pharmacy? Yes (Agent: If no, request that the patient contact the pharmacy for the refill. If patient does not wish to contact the pharmacy document the reason why and proceed with request.) (Agent: If yes, when and what did the pharmacy advise?) -Patient stated he called the pharmacy and the automated system was not much help.  Is this the correct pharmacy for this prescription? Yes If no, delete pharmacy and type the correct one.  This is the patient's preferred pharmacy:  [id number] [place], [place] - [address] [place] [address] Phone: [phone] Fax: [phone]   [facility] (New Address) - [place], [place] - [address] AT Previously: [address], [place] [address] [place] [address] Phone: [phone] Fax: [phone]   Has the prescription been filled recently? Yes  Is the patient out of the medication? Yes  Has the patient been seen for an appointment in the last year OR does the patient have an upcoming appointment? Yes  Can we respond through MyChart? Yes  Agent: Please be advised that Rx refills may take up to 3 business days. We ask that you follow-up with your pharmacy.

## 2023-10-10 MED ORDER — TESTOSTERONE CYPIONATE 200 MG/ML IM SOLN
200.0000 mg | INTRAMUSCULAR | 2 refills | Status: DC
Start: 2023-10-10 — End: 2024-04-13

## 2023-10-18 ENCOUNTER — Ambulatory Visit (INDEPENDENT_AMBULATORY_CARE_PROVIDER_SITE_OTHER): Payer: Commercial Managed Care - PPO

## 2023-10-18 DIAGNOSIS — J309 Allergic rhinitis, unspecified: Secondary | ICD-10-CM

## 2023-10-24 ENCOUNTER — Other Ambulatory Visit: Payer: Self-pay | Admitting: Internal Medicine

## 2023-11-11 ENCOUNTER — Ambulatory Visit (INDEPENDENT_AMBULATORY_CARE_PROVIDER_SITE_OTHER): Payer: Commercial Managed Care - PPO

## 2023-11-11 DIAGNOSIS — J309 Allergic rhinitis, unspecified: Secondary | ICD-10-CM | POA: Diagnosis not present

## 2023-12-02 DIAGNOSIS — J3089 Other allergic rhinitis: Secondary | ICD-10-CM | POA: Diagnosis not present

## 2023-12-02 NOTE — Progress Notes (Signed)
 VIALS MADE 12-02-23. EXP 12-01-24

## 2023-12-13 ENCOUNTER — Ambulatory Visit (INDEPENDENT_AMBULATORY_CARE_PROVIDER_SITE_OTHER): Payer: Self-pay

## 2023-12-13 DIAGNOSIS — J309 Allergic rhinitis, unspecified: Secondary | ICD-10-CM

## 2024-01-10 ENCOUNTER — Ambulatory Visit (INDEPENDENT_AMBULATORY_CARE_PROVIDER_SITE_OTHER): Payer: Self-pay

## 2024-01-10 DIAGNOSIS — J309 Allergic rhinitis, unspecified: Secondary | ICD-10-CM | POA: Diagnosis not present

## 2024-02-20 ENCOUNTER — Ambulatory Visit: Payer: Commercial Managed Care - PPO | Admitting: Internal Medicine

## 2024-02-20 NOTE — Patient Instructions (Incomplete)
 Seasonal and perennial allergic rhinitis Continue allergy  injections per protocol and bring epipen  to your appointments.  Continue Carbinaxomine 4mg /50ml -take 10ml twice a day as needed.  Continue Atrovent  nasal spray 2 sprays each nostril daily (can be used up to 3-4 times a day as needed for nasal drainage control).  If nasal passage is getting too dry then decrease and/or stop use Continue your nasal rinse regimen with mometasone and mupirocin  as directed by Dr. Layman Pries can take over these prescriptions if we need to  Mild Persistent Asthma Have access to albuterol  inhaler 2 puffs every 4-6 hours as needed for cough/wheeze/shortness of breath/chest tightness.  May use 15-20 minutes prior to activity.   Monitor frequency of use.   Dulera  100 mcg 2 puffs daily. Increase to twice daily during coughing flares and continue for 2 weeks or until symptoms resolve  Follow-up in  months or sooner if needed

## 2024-02-21 ENCOUNTER — Encounter: Payer: Self-pay | Admitting: Family

## 2024-02-21 ENCOUNTER — Ambulatory Visit: Admitting: Family

## 2024-02-21 ENCOUNTER — Other Ambulatory Visit: Payer: Self-pay

## 2024-02-21 VITALS — BP 128/86 | HR 67 | Temp 97.9°F | Resp 18 | Ht 76.0 in | Wt 231.2 lb

## 2024-02-21 DIAGNOSIS — J3089 Other allergic rhinitis: Secondary | ICD-10-CM | POA: Diagnosis not present

## 2024-02-21 DIAGNOSIS — J4530 Mild persistent asthma, uncomplicated: Secondary | ICD-10-CM | POA: Diagnosis not present

## 2024-02-21 DIAGNOSIS — J302 Other seasonal allergic rhinitis: Secondary | ICD-10-CM

## 2024-02-21 DIAGNOSIS — R053 Chronic cough: Secondary | ICD-10-CM | POA: Diagnosis not present

## 2024-02-21 MED ORDER — EPINEPHRINE 0.3 MG/0.3ML IJ SOAJ: 0.3000 mg | Freq: Once | INTRAMUSCULAR | 1 refills | Status: AC | PRN

## 2024-02-21 MED ORDER — DULERA 200-5 MCG/ACT IN AERO: INHALATION_SPRAY | RESPIRATORY_TRACT | 3 refills | Status: AC

## 2024-02-21 MED ORDER — ALBUTEROL SULFATE HFA 108 (90 BASE) MCG/ACT IN AERS: INHALATION_SPRAY | RESPIRATORY_TRACT | 1 refills | Status: AC

## 2024-02-21 MED ORDER — CARBINOXAMINE MALEATE 4 MG/5ML PO SOLN: 10.0000 mL | Freq: Two times a day (BID) | ORAL | 5 refills | Status: DC | PRN

## 2024-02-21 NOTE — Progress Notes (Signed)
 [address] [place] [address] Dept: [phone]  FOLLOW UP NOTE  Patient ID: Seth Lowery, male    DOB: 1971/07/20  Age: 53 y.o. MRN: [id number] Date of Office Visit: 02/21/2024  Assessment  Chief Complaint: Follow-up  HPI Seth Lowery is a 53 year old male who presents today for follow-up of seasonal and perennial allergic rhinitis and mild persistent asthma.  He was last seen on August 22, 2023 by Dr. Cornel Diesel.  He denies any new diagnosis or surgery since his last office visit.  He mentions in the next month he will be moving to the [facility] [place]  area.  Seasonal and perennial allergic rhinitis: He continues to receive allergy  injections per protocol and denies any problems or reactions with his allergy  injections.  He does have an epinephrine  autoinjector device and does know how to use it.  He continues to take carbinoxamine  4 mg per 5 mL 10 mL once a day.  If there are weather changes he does increase the carbinoxamine  to twice a day.  He is currently using Atrovent  nasal spray 2 sprays each nostril once a day and is no longer using saline rinse with mometasone and mupirocin  as directed by Dr. Darlin Ehrlich.  He had mixed emotions about using this regimen.  He felt like this regimen kept him more moist.  He could not tell a difference when he stopped this regimen.  He feels like his allergies are the best they have been in the past 7 to 8 years.  He reports postnasal drip that has been an ongoing problem.  He denies rhinorrhea and nasal congestion.  He is able to taste and smell.  He has not been treated for any sinus infections since we last saw him.  His postnasal drip usually starts around 9 to 11 AM and will leave around 2 to 4 PM.  He notices that the drainage can be worse with changes in bariatric pressure.  He has previously had 2 sinus surgeries.  The most recent being approximately 4 years ago with Dr. Darlin Ehrlich.  Mild persistent asthma: He reports cough due to postnasal drip  and also mentions wheezing, but feels like it is more due to the drainage.  It feels like it goes from a wheeze to a rattle.  He denies tightness in chest, shortness of breath, and nocturnal awakenings due to breathing problems.  Since his last office visit he has not required any systemic steroids or made any trips to the emergency room or urgent care due to breathing problems.  He has not used his albuterol  in a year.  He continues to take Dulera , but for the past 6 months he has decreased Dulera  100 mcg to 1 puff once a day.  He was previously taking 2 puffs daily and has not noticed increase in symptoms since decreasing.  He has previously been on omeprazole  due to the cough, but it did not make a difference in his symptoms.  He denies any heartburn or reflux symptoms.   Drug Allergies:  No Known Allergies  Review of Systems: Negative except as per HPI   Physical Exam: BP 128/86   Pulse 67   Temp 97.9 F (36.6 C) (Temporal)   Resp 18   Ht 6\' 4"  (1.93 m)   Wt 231 lb 3.2 oz (104.9 kg)   SpO2 95%   BMI 28.14 kg/m    Physical Exam Constitutional:      Appearance: Normal appearance.  HENT:  Head: Normocephalic and atraumatic.     Comments: Pharynx normal, eyes normal, ears normal, nose normal    Right Ear: Tympanic membrane, ear canal and external ear normal.     Left Ear: Tympanic membrane, ear canal and external ear normal.     Nose: Nose normal.     Mouth/Throat:     Mouth: Mucous membranes are moist.     Pharynx: Oropharynx is clear.  Eyes:     Conjunctiva/sclera: Conjunctivae normal.  Cardiovascular:     Rate and Rhythm: Regular rhythm.     Heart sounds: Normal heart sounds.  Pulmonary:     Effort: Pulmonary effort is normal.     Breath sounds: Normal breath sounds.     Comments: Lungs clear to auscultation Musculoskeletal:     Cervical back: Neck supple.  Skin:    General: Skin is warm.  Neurological:     Mental Status: He is alert and oriented to person,  place, and time.  Psychiatric:        Mood and Affect: Mood normal.        Behavior: Behavior normal.        Thought Content: Thought content normal.        Judgment: Judgment normal.     Diagnostics: FVC 4.38 L (73%), FEV1 3.32 L (72%), FEV1/FVC 0.76.  Predicted FVC 5.98 L, predicted FEV1 4.62 L.  Spirometry indicates possible mild restriction.  Assessment and Plan: 1. Seasonal and perennial allergic rhinitis   2. Mild persistent asthma without complication   3. Chronic cough     Meds ordered this encounter  Medications   Carbinoxamine  Maleate 4 MG/5ML SOLN    Sig: Take 10 mLs (8 mg total) by mouth 2 (two) times daily as needed.    Dispense:  473 mL    Refill:  5   mometasone-formoterol (DULERA ) 200-5 MCG/ACT AERO    Sig: Inhale 1 puff once a day.  Rinse mouth out afterwards.  During upper respiratory infection/asthma flares increase to 2 puffs twice a day for 1 to 2 weeks or until symptoms return to baseline    Dispense:  1 each    Refill:  3   albuterol  (VENTOLIN  HFA) 108 (90 Base) MCG/ACT inhaler    Sig: Inhale 2 puffs every 4-6 hours as needed for cough, wheeze, tightness in chest, or shortness of breath.    Dispense:  18 g    Refill:  1   EPINEPHrine  (EPIPEN  2-PAK) 0.3 mg/0.3 mL IJ SOAJ injection    Sig: Inject 0.3 mg into the muscle once as needed for up to 1 dose for anaphylaxis.    Dispense:  2 each    Refill:  1    Generic brand okay (MYLAN OR TEVA).  No PA should be needed.    Patient Instructions  Seasonal and perennial allergic rhinitis Continue allergy  injections per protocol and bring epipen  to your appointments.  Continue Carbinaxomine 4mg /98ml -take 10ml once to twice a day as needed.  Continue Atrovent  nasal spray 2 sprays each nostril daily (can be used up to 3-4 times a day as needed for nasal drainage control).  If nasal passage is getting too dry then decrease and/or stop use Continue your nasal rinse as needed  Mild Persistent Asthma Have access to  albuterol  inhaler 2 puffs every 4-6 hours as needed for cough/wheeze/shortness of breath/chest tightness.  May use 15-20 minutes prior to activity.   Monitor frequency of use.   Dulera  100 mcg  puff  daily. Rinse mouth out after. Increase to 2 puffs twice daily during coughing flares and continue for 2 weeks or until symptoms resolve  Follow-up as needed Recommend Dr. Wilfredo Hanly as an allergist in [place]   Return if symptoms worsen or fail to improve.    Thank you for the opportunity to care for this patient.  Please do not hesitate to contact me with questions.  Tinnie Forehand, FNP [facility] 

## 2024-03-30 ENCOUNTER — Ambulatory Visit (INDEPENDENT_AMBULATORY_CARE_PROVIDER_SITE_OTHER): Payer: Self-pay

## 2024-03-30 DIAGNOSIS — J309 Allergic rhinitis, unspecified: Secondary | ICD-10-CM | POA: Diagnosis not present

## 2024-04-11 ENCOUNTER — Other Ambulatory Visit: Payer: Self-pay | Admitting: Family Medicine

## 2024-04-11 DIAGNOSIS — E291 Testicular hypofunction: Secondary | ICD-10-CM

## 2024-04-22 ENCOUNTER — Other Ambulatory Visit: Payer: Self-pay | Admitting: Family

## 2024-04-22 NOTE — Telephone Encounter (Signed)
 What are the alternatives that are covered by his insurance ? ( Advair HFA or Symbicort ?)We are not able to tell what the cost will be for inhalers unfortunately

## 2024-04-23 ENCOUNTER — Ambulatory Visit: Payer: Commercial Managed Care - PPO | Admitting: Family Medicine

## 2024-04-23 NOTE — Telephone Encounter (Signed)
 Please see if Advair HFA or Symbicort are preferred by his insurance

## 2024-04-24 ENCOUNTER — Ambulatory Visit: Payer: Commercial Managed Care - PPO | Admitting: Family Medicine

## 2024-04-27 ENCOUNTER — Encounter: Payer: Self-pay | Admitting: Family Medicine

## 2024-04-27 ENCOUNTER — Ambulatory Visit: Admitting: Family Medicine

## 2024-04-27 VITALS — BP 128/86 | HR 70 | Temp 98.7°F | Ht 76.0 in | Wt 228.4 lb

## 2024-04-27 DIAGNOSIS — Z13220 Encounter for screening for lipoid disorders: Secondary | ICD-10-CM | POA: Diagnosis not present

## 2024-04-27 DIAGNOSIS — R030 Elevated blood-pressure reading, without diagnosis of hypertension: Secondary | ICD-10-CM | POA: Diagnosis not present

## 2024-04-27 DIAGNOSIS — Z131 Encounter for screening for diabetes mellitus: Secondary | ICD-10-CM

## 2024-04-27 DIAGNOSIS — E291 Testicular hypofunction: Secondary | ICD-10-CM | POA: Diagnosis not present

## 2024-04-27 LAB — TESTOSTERONE TOTAL,FREE,BIO, MALES
Albumin: 4.7 g/dL (ref 3.6–5.1)
Sex Hormone Binding: 32 nmol/L (ref 10–50)
Testosterone, Bioavailable: 96.6 ng/dL — ABNORMAL LOW (ref 110.0–575.0)
Testosterone, Free: 45.1 pg/mL — ABNORMAL LOW (ref 46.0–224.0)
Testosterone: 346 ng/dL (ref 250–827)

## 2024-04-27 LAB — HEMOGLOBIN A1C: Hgb A1c MFr Bld: 5.7 % (ref 4.6–6.5)

## 2024-04-27 LAB — COMPREHENSIVE METABOLIC PANEL WITH GFR
ALT: 33 U/L (ref 0–53)
AST: 37 U/L (ref 0–37)
Albumin: 4.5 g/dL (ref 3.5–5.2)
Alkaline Phosphatase: 63 U/L (ref 39–117)
BUN: 20 mg/dL (ref 6–23)
CO2: 25 meq/L (ref 19–32)
Calcium: 8.6 mg/dL (ref 8.4–10.5)
Chloride: 106 meq/L (ref 96–112)
Creatinine, Ser: 0.94 mg/dL (ref 0.40–1.50)
GFR: 93.02 mL/min (ref >60.00)
Glucose, Bld: 80 mg/dL (ref 70–99)
Potassium: 4 meq/L (ref 3.5–5.1)
Sodium: 141 meq/L (ref 135–145)
Total Bilirubin: 1 mg/dL (ref 0.2–1.2)
Total Protein: 6.7 g/dL (ref 6.0–8.3)

## 2024-04-27 LAB — LIPID PANEL
Cholesterol: 152 mg/dL (ref 0–200)
HDL: 47.5 mg/dL (ref >39.00)
LDL Cholesterol: 95 mg/dL (ref 0–99)
NonHDL: 104.94
Total CHOL/HDL Ratio: 3
Triglycerides: 49 mg/dL (ref 0.0–149.0)
VLDL: 9.8 mg/dL (ref 0.0–40.0)

## 2024-04-27 LAB — CBC
HCT: 48.3 % (ref 39.0–52.0)
Hemoglobin: 16.2 g/dL (ref 13.0–17.0)
MCHC: 33.6 g/dL (ref 30.0–36.0)
MCV: 84.5 fl (ref 78.0–100.0)
Platelets: 279 K/uL (ref 150.0–400.0)
RBC: 5.72 Mil/uL (ref 4.22–5.81)
RDW: 14 % (ref 11.5–15.5)
WBC: 7.5 K/uL (ref 4.0–10.5)

## 2024-04-27 NOTE — Progress Notes (Signed)
 Established Patient Office Visit   Subjective:  Patient ID: Seth Lowery, male    DOB: Apr 28, 1971  Age: 53 y.o. MRN: [id number]  Chief Complaint  Patient presents with   Annual Exam    Fasting no concerns    HPI Encounter Diagnoses  Name Primary?   Androgen deficiency Yes   Screening for hyperlipidemia    Screening for diabetes mellitus    Elevated BP without diagnosis of hypertension    For follow-up of above.  Last took testosterone  injections 17 days ago.  Continues exercising.  Wife has been checking blood pressure at home when he does not recall it being elevated there.  Both parents have diabetes.   Review of Systems  Constitutional: Negative.   HENT: Negative.    Eyes:  Negative for blurred vision, discharge and redness.  Respiratory: Negative.    Cardiovascular: Negative.   Gastrointestinal:  Negative for abdominal pain.  Genitourinary: Negative.   Musculoskeletal: Negative.  Negative for myalgias.  Skin:  Negative for rash.  Neurological:  Negative for tingling, loss of consciousness and weakness.  Endo/Heme/Allergies:  Negative for polydipsia.     Current Outpatient Medications:    albuterol  (VENTOLIN  HFA) 108 (90 Base) MCG/ACT inhaler, Inhale 2 puffs every 4-6 hours as needed for cough, wheeze, tightness in chest, or shortness of breath., Disp: 18 g, Rfl: 1   Carbinoxamine  Maleate 4 MG/5ML SOLN, Take 10 mLs (8 mg total) by mouth 2 (two) times daily as needed., Disp: 473 mL, Rfl: 5   EPINEPHrine  (EPIPEN  2-PAK) 0.3 mg/0.3 mL IJ SOAJ injection, Inject 0.3 mg into the muscle once as needed for up to 1 dose for anaphylaxis., Disp: 2 each, Rfl: 1   ipratropium (ATROVENT ) 0.06 % nasal spray, PLACE 2 SPRAYS INTO BOTH NOSTRILS 2 (TWO) TIMES DAILY AS NEEDED FOR RHINITIS., Disp: 45 mL, Rfl: 1   mometasone-formoterol  (DULERA ) 200-5 MCG/ACT AERO, Inhale 1 puff once a day.  Rinse mouth out afterwards.  During upper respiratory infection/asthma flares increase to 2 puffs  twice a day for 1 to 2 weeks or until symptoms return to baseline, Disp: 1 each, Rfl: 3   testosterone  cypionate (DEPOTESTOSTERONE CYPIONATE) 200 MG/ML injection, INJECT 1 ML (200 MG TOTAL) INTO THE MUSCLE EVERY 14 DAYS, Disp: 6 mL, Rfl: 0   Objective:     BP 128/86 (BP Location: Right Arm, Patient Position: Sitting, Cuff Size: Normal)   Pulse 70   Temp 98.7 F (37.1 C) (Temporal)   Ht 6' 4 (1.93 m)   Wt 228 lb 6.4 oz (103.6 kg)   SpO2 95%   BMI 27.80 kg/m  BP Readings from Last 3 Encounters:  04/27/24 128/86  02/21/24 128/86  08/22/23 122/74   Wt Readings from Last 3 Encounters:  04/27/24 228 lb 6.4 oz (103.6 kg)  02/21/24 231 lb 3.2 oz (104.9 kg)  04/23/23 226 lb 9.6 oz (102.8 kg)      Physical Exam Constitutional:      General: He is not in acute distress.    Appearance: Normal appearance. He is not ill-appearing, toxic-appearing or diaphoretic.  HENT:     Head: Normocephalic and atraumatic.     Right Ear: External ear normal.     Left Ear: External ear normal.     Mouth/Throat:     Mouth: Mucous membranes are moist.     Pharynx: Oropharynx is clear. No oropharyngeal exudate or posterior oropharyngeal erythema.  Eyes:     General: No scleral icterus.  Right eye: No discharge.        Left eye: No discharge.     Extraocular Movements: Extraocular movements intact.     Conjunctiva/sclera: Conjunctivae normal.     Pupils: Pupils are equal, round, and reactive to light.  Cardiovascular:     Rate and Rhythm: Normal rate and regular rhythm.  Pulmonary:     Effort: Pulmonary effort is normal. No respiratory distress.     Breath sounds: Normal breath sounds.  Abdominal:     General: Bowel sounds are normal.  Musculoskeletal:     Cervical back: No rigidity or tenderness.  Skin:    General: Skin is warm and dry.  Neurological:     Mental Status: He is alert and oriented to person, place, and time.  Psychiatric:        Mood and Affect: Mood normal.         Behavior: Behavior normal.      No results found for any visits on 04/27/24.    The 10-year ASCVD risk score (Arnett DK, et al., 2019) is: 3.5%    Assessment & Plan:   Androgen deficiency -     CBC -     Testosterone  Total,Free,Bio, Males  Screening for hyperlipidemia -     Comprehensive metabolic panel with GFR -     Lipid panel  Screening for diabetes mellitus -     Comprehensive metabolic panel with GFR -     Hemoglobin A1c  Elevated BP without diagnosis of hypertension    Return in about 6 months (around 10/28/2024) for chronic disease follow-up, annual physical.  Continue exercise and weight loss efforts.  Check and record blood pressures periodically.  Information given on preventing hypertension.  Information given on preventing type 2 diabetes.   Elsie Sim Lent, MD

## 2024-04-28 ENCOUNTER — Ambulatory Visit: Payer: Self-pay | Admitting: Family Medicine

## 2024-05-01 ENCOUNTER — Other Ambulatory Visit: Payer: Self-pay

## 2024-05-01 DIAGNOSIS — J4530 Mild persistent asthma, uncomplicated: Secondary | ICD-10-CM

## 2024-05-01 MED ORDER — BUDESONIDE-FORMOTEROL FUMARATE 160-4.5 MCG/ACT IN AERO: 1.0000 | INHALATION_SPRAY | Freq: Every day | RESPIRATORY_TRACT | 12 refills | Status: AC

## 2024-05-01 MED ORDER — BREATHE COMFORT CHAMBER/ADULT DEVI: 0 refills | Status: AC

## 2024-06-25 ENCOUNTER — Encounter: Payer: Self-pay | Admitting: Gastroenterology

## 2024-08-09 ENCOUNTER — Other Ambulatory Visit: Payer: Self-pay | Admitting: Family Medicine

## 2024-08-09 DIAGNOSIS — E291 Testicular hypofunction: Secondary | ICD-10-CM

## 2024-08-10 ENCOUNTER — Other Ambulatory Visit: Payer: Self-pay | Admitting: Family

## 2024-08-12 ENCOUNTER — Encounter: Payer: Self-pay | Admitting: Family Medicine

## 2024-08-12 DIAGNOSIS — E291 Testicular hypofunction: Secondary | ICD-10-CM

## 2024-08-17 NOTE — Telephone Encounter (Signed)
 Pt requesting refill for testosterone  cypionate (DEPOTESTOSTERONE CYPIONATE) 200 MG/ML injection   LOV 04/27/24 FOV not scheduled  LRF 08/11/24

## 2024-10-04 ENCOUNTER — Other Ambulatory Visit: Payer: Self-pay | Admitting: Family
